# Patient Record
Sex: Female | Born: 1955
Health system: Southern US, Community
[De-identification: ages and names within clinical notes are randomized; demographics above are authoritative.]

## PROBLEM LIST (undated history)

## (undated) DIAGNOSIS — M1990 Unspecified osteoarthritis, unspecified site: Secondary | ICD-10-CM

## (undated) DIAGNOSIS — D649 Anemia, unspecified: Secondary | ICD-10-CM

## (undated) DIAGNOSIS — E0590 Thyrotoxicosis, unspecified without thyrotoxic crisis or storm: Secondary | ICD-10-CM

## (undated) DIAGNOSIS — E119 Type 2 diabetes mellitus without complications: Secondary | ICD-10-CM

## (undated) DIAGNOSIS — I10 Essential (primary) hypertension: Secondary | ICD-10-CM

## (undated) DIAGNOSIS — E7800 Pure hypercholesterolemia, unspecified: Secondary | ICD-10-CM

## (undated) DIAGNOSIS — K219 Gastro-esophageal reflux disease without esophagitis: Secondary | ICD-10-CM

## (undated) HISTORY — PX: COLONOSCOPY W/ POLYPECTOMY: SHX1380

## (undated) HISTORY — DX: Pure hypercholesterolemia, unspecified: E78.00

## (undated) HISTORY — PX: KNEE ARTHROSCOPY: SUR90

---

## 1998-01-14 ENCOUNTER — Other Ambulatory Visit: Admission: RE | Admit: 1998-01-14 | Discharge: 1998-01-14 | Payer: Self-pay | Admitting: *Deleted

## 1999-10-12 ENCOUNTER — Other Ambulatory Visit: Admission: RE | Admit: 1999-10-12 | Discharge: 1999-10-12 | Payer: Self-pay | Admitting: *Deleted

## 2000-12-15 ENCOUNTER — Other Ambulatory Visit: Admission: RE | Admit: 2000-12-15 | Discharge: 2000-12-15 | Payer: Self-pay | Admitting: Obstetrics & Gynecology

## 2000-12-15 ENCOUNTER — Encounter (INDEPENDENT_AMBULATORY_CARE_PROVIDER_SITE_OTHER): Payer: Self-pay

## 2001-04-07 ENCOUNTER — Other Ambulatory Visit: Admission: RE | Admit: 2001-04-07 | Discharge: 2001-04-07 | Payer: Self-pay | Admitting: Obstetrics & Gynecology

## 2001-04-07 ENCOUNTER — Encounter (INDEPENDENT_AMBULATORY_CARE_PROVIDER_SITE_OTHER): Payer: Self-pay

## 2003-12-16 ENCOUNTER — Inpatient Hospital Stay (HOSPITAL_COMMUNITY): Admission: EM | Admit: 2003-12-16 | Discharge: 2003-12-17 | Payer: Self-pay | Admitting: Emergency Medicine

## 2003-12-19 ENCOUNTER — Encounter: Admission: RE | Admit: 2003-12-19 | Discharge: 2003-12-19 | Payer: Self-pay | Admitting: Family Medicine

## 2010-02-10 ENCOUNTER — Encounter (HOSPITAL_COMMUNITY): Admission: RE | Admit: 2010-02-10 | Discharge: 2010-05-11 | Payer: Self-pay | Admitting: Internal Medicine

## 2010-02-25 ENCOUNTER — Encounter: Admission: RE | Admit: 2010-02-25 | Discharge: 2010-02-25 | Payer: Self-pay | Admitting: Internal Medicine

## 2010-04-07 ENCOUNTER — Encounter: Admission: RE | Admit: 2010-04-07 | Discharge: 2010-04-07 | Payer: Self-pay | Admitting: Endocrinology

## 2010-04-07 ENCOUNTER — Other Ambulatory Visit: Admission: RE | Admit: 2010-04-07 | Discharge: 2010-04-07 | Payer: Self-pay | Admitting: Diagnostic Radiology

## 2010-05-13 ENCOUNTER — Ambulatory Visit (HOSPITAL_COMMUNITY): Admission: RE | Admit: 2010-05-13 | Discharge: 2010-05-13 | Payer: Self-pay | Admitting: Endocrinology

## 2010-09-25 ENCOUNTER — Other Ambulatory Visit
Admission: RE | Admit: 2010-09-25 | Discharge: 2010-09-25 | Payer: Self-pay | Source: Home / Self Care | Admitting: Registered Nurse

## 2010-11-15 ENCOUNTER — Encounter: Payer: Self-pay | Admitting: Endocrinology

## 2011-01-09 LAB — HCG, SERUM, QUALITATIVE: Preg, Serum: NEGATIVE

## 2011-03-12 NOTE — Discharge Summary (Signed)
NAMEMarland House  AUBERY, DATE                        ACCOUNT NO.:  [id number]   MEDICAL RECORD NO.:  [id number]                   PATIENT TYPE:  INP   LOCATION:  5703                                 FACILITY:  [facility]   PHYSICIAN:  Emmit Alexanders, M.D.                      DATE OF BIRTH:  07/30/1956   DATE OF ADMISSION:  12/16/2003  DATE OF DISCHARGE:  12/17/2003                                 DISCHARGE SUMMARY   DISCHARGE DIAGNOSES:  1. Acute bronchitis.  2. Bronchospasm.  3. Hypertension.  4. Obesity.   CONSULTATIONS:  None.   PROCEDURE:  None.   RADIOGRAPHIC DATA:  Chest x-ray that was performed at [facility]  seemed to be consistent with acute bronchitis, questionable  new infiltrate  in the right lower lobe compared to December 13, 2003.   LABORATORY DATA:  Basic metabolic panel at discharge showed a sodium 138,  potassium 3.3, chloride 103, CO2 31, glucose 167, BUN 6, creatinine 0.7,  calcium 8.5.  CBC on admission showed a white blood cell count of 9,  hemoglobin 12.5, hematocrit 38.2, MCV 80.7, RDW 14.5, platelet count 325  with a normal differential.  Cardiac enzymes x2 were within normal limits at  the point of care in the emergency room.   EKG showed a normal sinus rhythm with a rate of 89 without ST changes.   HOSPITAL COURSE:  PROBLEM #1 -  ACUTE BRONCHITIS WITH BRONCHOSPASM:  This is  a 55 year old African American female patient who was seen at [facility] on [address]  She was initially seen last Friday with complaints of  chest pressure, headache and sore throat.  She was started on a Z pack.  She  was asked to follow up next week if she was not feeling better.  She  returned on Monday with worsening of her symptoms.  She was found to have  desaturations to  85% on room air.  She was therefore, sent to the hospital  to be admitted.  While in the hospital, she was treated with albuterol  nebulizers with improvement of her symptoms.  She was found to  be in  bronchospasm with wheezing on examination.  She was maintained on oxygen to  keep her saturations greater than 90%.  During her short stay, her  saturations improved to being weaned off oxygen with saturation of greater  than 98% on room air at rest.  After nebulizer treatment and walking the  halls, her saturations dropped to 90% but the patient was comfortable.  She  will be sent home on an albuterol MDI to take two puffs every two hours as  needed with a spacer.  She was treated with Tussionex and Guaifenesin here  in the hospital and will be discharged on the same.  She has follow-up  scheduled with Dr. Nicholos Johns on Tuesday, December 24, 2003.  Spirometer  may  be indicated in the future in this patient without a history of asthma or  smoking.   PROBLEM #2 -  HYPERTENSION:  The [facility] was [facility] to have an  elevated blood pressure up to 195/101.  This could be an acute stress  response.  During her hospitalization, she maintained a blood pressure in  between 124 and 168/72 to 83.  This was not treated during her acute  illness.  This will need to be followed as an outpatient.   DISCHARGE MEDICATIONS:  1. Albuterol MDI with spacer two puffs every two hours as needed for     shortness of breath or wheezing.  2. Guaifenesin 100 mg/5 mL two teaspoons p.o. every four hours as needed for     cough.  3. Tussionex one teaspoon every 12 hours as needed for cough.   PAIN MANAGEMENT:  Tylenol as needed.   ACTIVITY:  No restrictions.   DIET:  No restrictions.   WOUND CARE:  Not applicable.   SPECIAL INSTRUCTIONS:  She needs to follow up with her primary care  physician to discuss spirometry and hypertension treatment.   FOLLOW UP:  The patient has an appointment with Dr. Nicholos Johns on [address] on Tuesday, December 24, 2003, at 9:50 a.m.  His phone number  is [phone].                                                Emmit Alexanders, M.D.    DV/MEDQ  D:   12/17/2003  T:  12/18/2003  Job:  [id number]   cc:   Georgianne Fick, M.D.  [address]  [place]  [place] [address]  Fax: [phone]   [facility]  [address]

## 2011-03-12 NOTE — H&P (Signed)
NAME:  Sarah House, Sarah House                        ACCOUNT NO.:  [id number]   MEDICAL RECORD NO.:  [id number]                   PATIENT TYPE:  INP   LOCATION:  5703                                 FACILITY:  [facility]   PHYSICIAN:  Nani Gasser, M.D.            DATE OF BIRTH:  11-02-1955   DATE OF ADMISSION:  12/16/2003  DATE OF DISCHARGE:  12/17/2003                                HISTORY & PHYSICAL   CHIEF COMPLAINT:  Hypoxemia.   HISTORY OF PRESENT ILLNESS:  The patient come in about four days ago. She  started to notice a sore throat and some chest tightness in her chest, which  started the following day. She also complains of a dry cough during this  time period and started to have fever and some nausea with diarrhea. The  patient states she went to [facility] and had a chest x-ray done  and was treated for a bronchitis with Azithromycin and Tussionex. At that  particular visit her O2 was 93%. Over the weekend, the patient continued to  feel poorly and have fevers and chills and started wheezing and noticed an  increased feeling of fatigue with exertion. The patient says she slept a lot  over the weekend. She returned to urgent medical care today, however, repeat  chest x-ray showed questionable new infiltrate. Her O2 saturation at that  time was 85-95% on room air and she had a temperature of 101.2. She was  given two albuterol nebs today and did get some relief of her wheezing and  chest tightness. Her O2 saturation came up to 94-95% after the two nebs. The  patient also has been using over-the-counter NyQuil in addition to her  Azithromycin and Tussionex. The patient's peak flows after nebulization or  180/200.   PAST MEDICAL HISTORY:  Hypertension having been recorded at her two visits  to urgent medical, but denies any previous history of hypertension.   REVIEW OF SYSTEMS:  Positive for fevers. She has lost seven pounds over the  past four days. HEENT:  Please see  above. No nasal congestion. She does wear  glasses and no vision changes. PULMONARY:  Shortness of breath with exertion  this weekend. CARDIAC:  No palpitations and some chest tightness, see above.  NEUROLOGIC:  No dizziness, no syncope, no numbness, or tingling. HEME:  Not  symptomatic. ENDOCRINE:  No hormonal changes and she is still menstruating.   PAST SURGICAL HISTORY:  No prior surgeries.   MEDICATIONS:  1. Caltrate-D daily.  2. B-6 daily.   ALLERGIES:  No known drug allergies.   SOCIAL HISTORY:  Lives with husband. She has one living son. She is a  Nurse, children's. She denies any tobacco, alcohol, or  drug use.   FAMILY HISTORY:  Mom died at age 73 from diabetes, hypertension, heart  disease, and renal disease. Dad died in his 10s from lymphoma.   PHYSICAL EXAMINATION:  VITAL SIGNS: Temperature 99.2, heart rate 94,  respirations 15, blood pressure 172/90, and she is 96% on 4 L nasal cannula.  GENERAL:  In no acute distress. Pleasant. Appears comfortable, obese.  HEENT:  Head is atraumatic, normocephalic. Extraocular movements intact.  Pupils equal round and reactive. Sclerae are white. Conjunctivae normal.  Oropharynx is clear.  PULMONARY:  She does have expiratory wheezes bilaterally and diffusely. No  crackles. The patient is giving good effort.  NECK:  No lymphadenopathy. No thyromegaly. Trachea is midline.  CARDIAC:  Regular rate and rhythm with no murmurs. Carotid pulses 2+  bilaterally. Dorsalis pedal pulses 2+ bilaterally. Carotid 2+ bilaterally,  no carotid bruit.  EXTREMITIES:  No lower extremity edema.  ABDOMEN:  Obese, nontender, nondistended with normal bowel sounds. Soft. No  organomegaly.  MUSCULOSKELETAL:  Grossly intact.   LABORATORY DATA:  From February 18 she had a white count of 9.8, hemoglobin  12.1, with negative cardiac enzymes. On February 21 she had a white count of  9 with a hemoglobin of 12.5 and a normal BMET with a  creatinine of 0.8. She  had cardiac markers in the emergency department x2, which were normal. EKG  on February 18 showed a rate of 89 with normal sinus rhythm and no ST-T wave  changes. Chest x-ray from February 21 showed evidence of bronchitis.  Impression was new infiltrate in the right lower lobe compared to a previous  chest x-ray on February 18.   ASSESSMENT:  A 55 year old with hypoxemia.   PLAN:  1. Hypoxemia probably secondary to acute bronchitis with new infiltrate.     Still febrile on Azithromycin. Suspect viral cause. Antibiotics for now     and monitor overnight. We will wean O2 as tolerated.  2. Hypertension.  The patient reports systolic blood pressure was normally     in the 130s. Suspect maybe that her current hypertension may be due to     acute illness and her medicines. We will follow. If blood pressure     remains elevated we will consider starting on an antihypertensive or at     her follow-up appointment with her PCP.  3. Chest tightness. Most likely secondary to bronchitis. The patient is low     risk for MI. We will repeat an EKG and one more set of enzymes.                                                Nani Gasser, M.D.    CM/MEDQ  D:  12/21/2003  T:  12/21/2003  Job:  [id number]

## 2012-02-23 IMAGING — US US BIOPSY
1 series · 13 of 14 positions shown · non-contrast
Comparison: none

CLINICAL HISTORY: 54-year-old with bilateral thyroid nodules.

[Series 1: us biopsy · 0.08mm/px · 14 acquisitions, 13 frames shown]
[im 1/14]
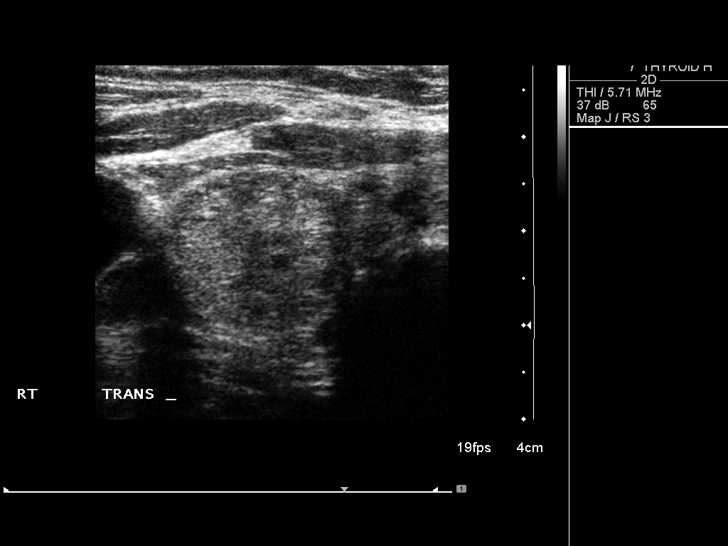
[im 2/14]
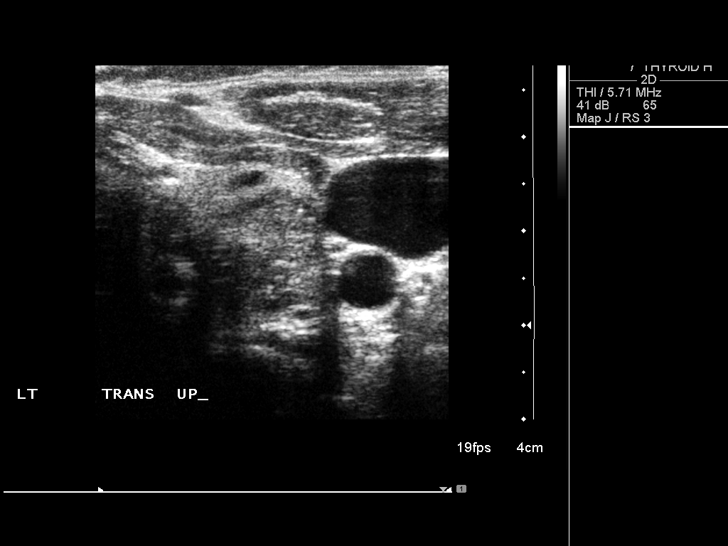
[im 3/14]
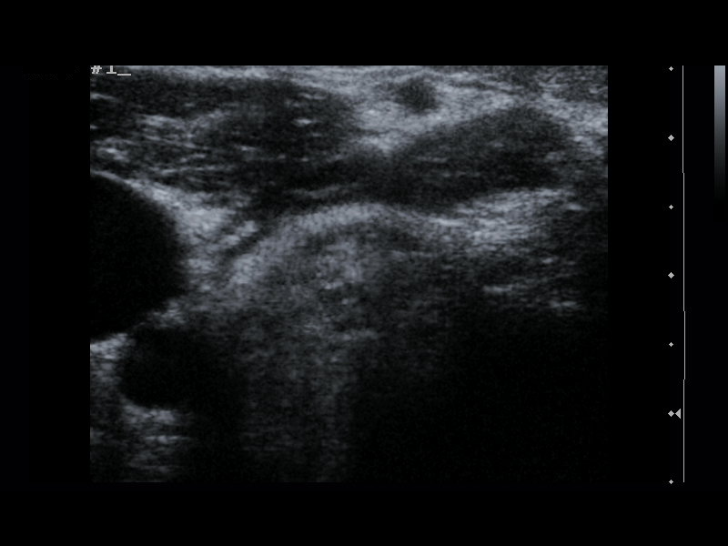
[im 4/14]
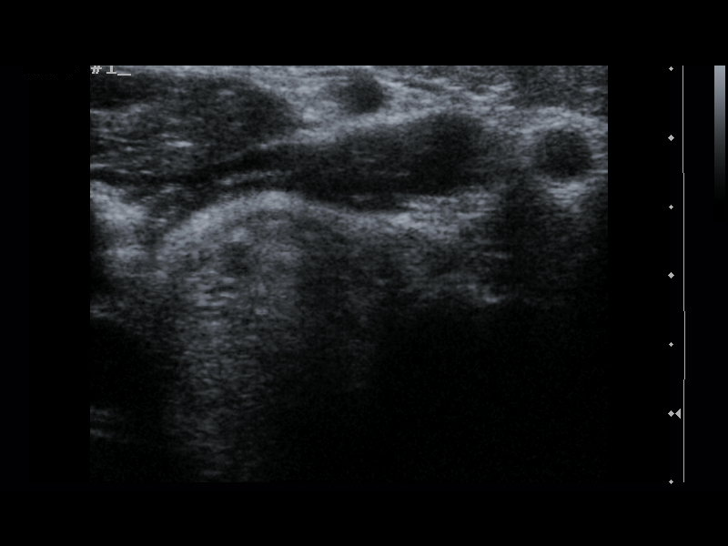
[im 5/14]
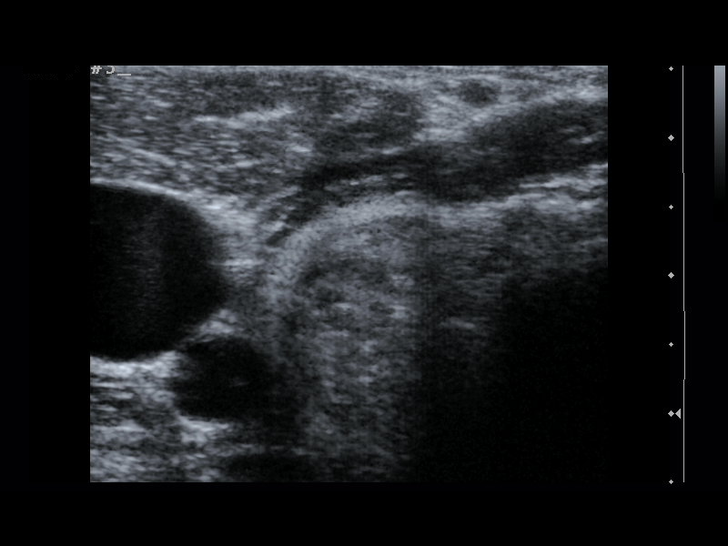
[im 6/14]
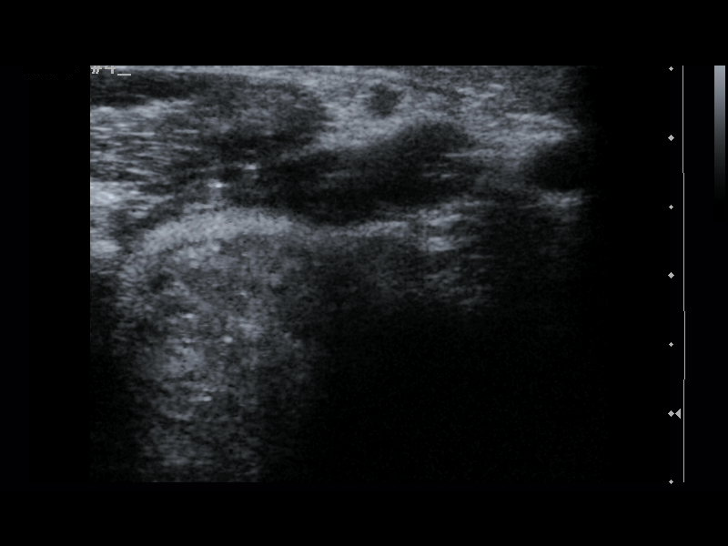
[im 8/14]
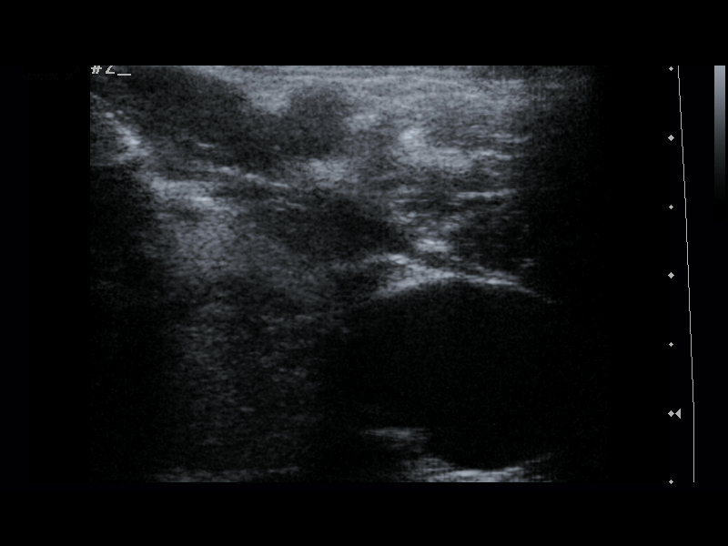
[im 9/14]
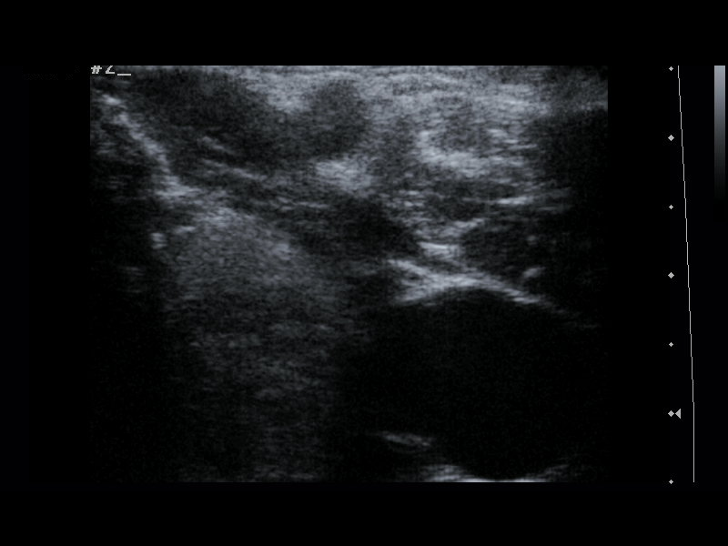
[im 10/14]
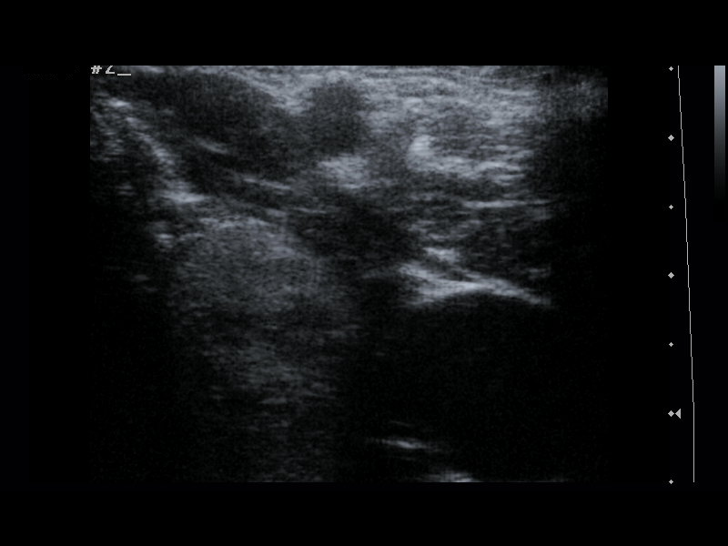
[im 11/14]
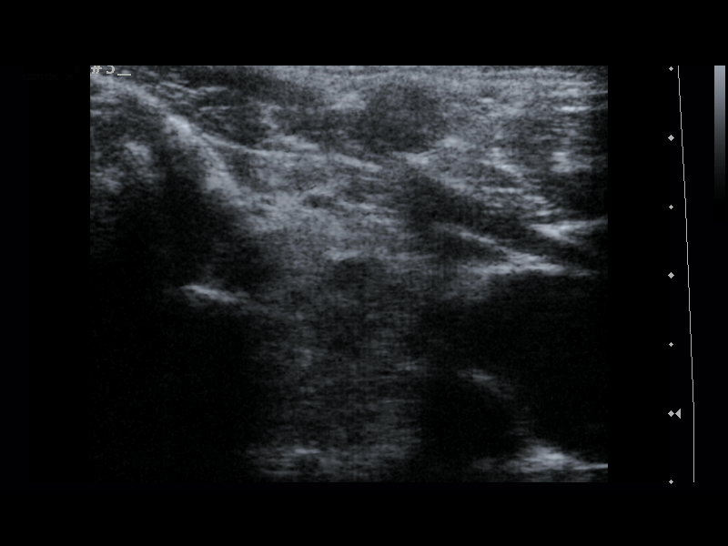
[im 12/14]
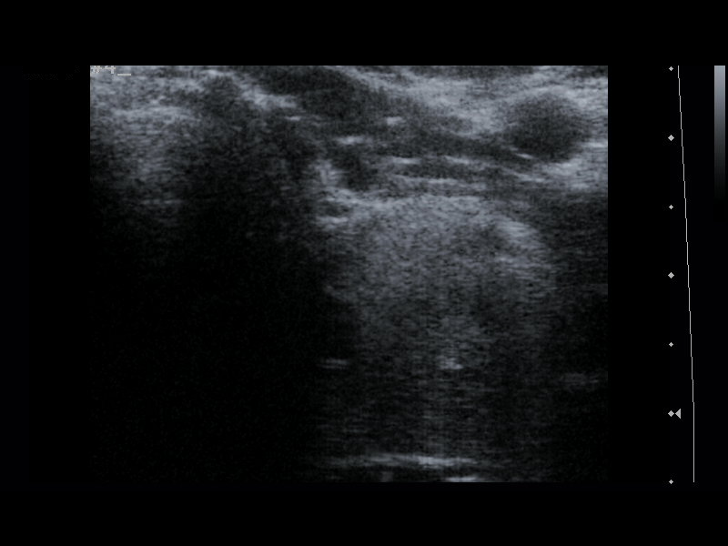
[im 13/14]
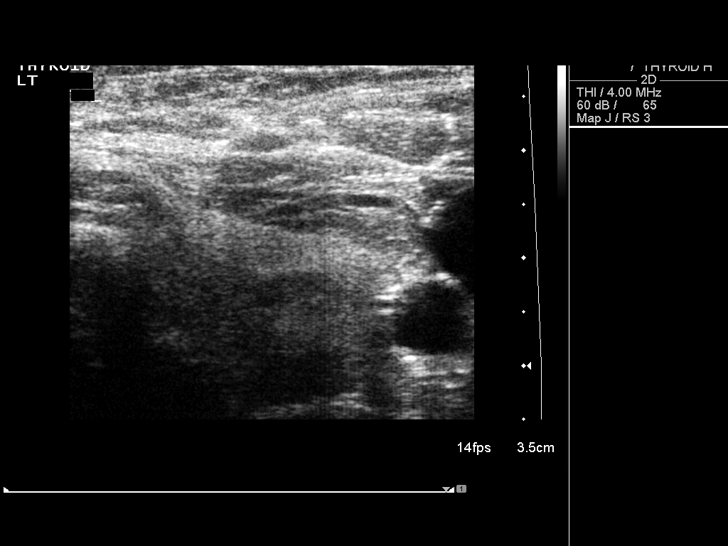
[im 14/14]
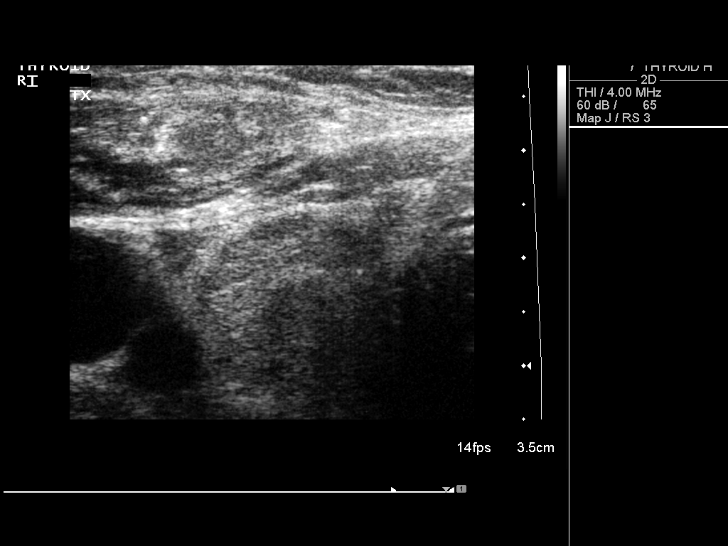

[13 of 14 positions shown; findings below may reference images not displayed]

PROCEDURE(S): ULTRASOUND GUIDED FINE NEEDLE ASPIRATION OF BILATERAL
THYROID NODULES

Medications:None

Moderate sedation time:None

Fluoroscopy time: None

Procedure:The procedure was explained to the patient.  The risks
and benefits of the procedure were discussed and the patient's
questions were addressed.  Informed consent was obtained from the
patient.  The neck was prepped and draped in a sterile fashion.
The large nodule in the right upper lobe was targeted.  The skin
was anesthetized with 1% lidocaine.  Four fine needle aspirations
were obtained with ultrasound guidance using 25 gauge needles.  No
immediate complication.

The dominant nodule in the upper portion of the left thyroid lobe
was targeted. The left neck was anesthetized with 1% lidocaine.
Four ultrasound guided fine needle aspirations were obtained with
25 gauge needles.  No immediate complication.
FINDINGS: Dominant heterogeneous nodule in the superior aspect of
the right thyroid lobe.  Dominant heterogeneous nodule in the upper
portion of the left thyroid lobe.
IMPRESSION: Ultrasound guided fine needle aspirations of bilateral
thyroid nodules.

## 2014-09-02 ENCOUNTER — Ambulatory Visit (INDEPENDENT_AMBULATORY_CARE_PROVIDER_SITE_OTHER): Payer: Managed Care, Other (non HMO) | Admitting: Podiatry

## 2014-09-02 DIAGNOSIS — B351 Tinea unguium: Secondary | ICD-10-CM

## 2014-09-02 DIAGNOSIS — M79673 Pain in unspecified foot: Secondary | ICD-10-CM

## 2014-09-04 NOTE — Progress Notes (Signed)
Subjective:     Patient ID: Sarah House, female   DOB: 07/25/1956, 58 y.o.   MRN: [id number]  HPIpatient presents with elongated nailbeds 1-5 both feet stating that they hurt her and makes shoe gear difficult   Review of Systems     Objective:   Physical Exam Neurovascular status intact with thick yellow brittle nailbeds 1-5 of both feet    Assessment:     Mycotic nail infection with pain 1-5 both feet    Plan:     Debride painful nailbeds 1-5 both feet with no iatrogenic bleeding noted      

## 2014-12-09 ENCOUNTER — Other Ambulatory Visit: Payer: Managed Care, Other (non HMO)

## 2015-01-06 ENCOUNTER — Ambulatory Visit (INDEPENDENT_AMBULATORY_CARE_PROVIDER_SITE_OTHER): Payer: Managed Care, Other (non HMO) | Admitting: Podiatry

## 2015-01-06 ENCOUNTER — Encounter: Payer: Self-pay | Admitting: Podiatry

## 2015-01-06 VITALS — BP 139/69 | HR 79 | Resp 20

## 2015-01-06 DIAGNOSIS — M79673 Pain in unspecified foot: Secondary | ICD-10-CM | POA: Diagnosis not present

## 2015-01-06 DIAGNOSIS — B351 Tinea unguium: Secondary | ICD-10-CM

## 2015-01-06 NOTE — Progress Notes (Signed)
Subjective:     Patient ID: Sarah House, female   DOB: 12-01-1955, 59 y.o.   MRN: [id number]  HPIpatient presents with elongated nailbeds 1-5 both feet stating that they hurt her and makes shoe gear difficult   Review of Systems     Objective:   Physical Exam Neurovascular status intact with thick yellow brittle nailbeds 1-5 of both feet    Assessment:     Mycotic nail infection with pain 1-5 both feet    Plan:     Debride painful nailbeds 1-5 both feet with no iatrogenic bleeding noted

## 2015-01-07 NOTE — Progress Notes (Signed)
Subjective:     Patient ID: Sarah House, female   DOB: [birth year], 59 y.o.   MRN: [id number]  HPI patient presents stating I have nails that are bothering me 1-5 both feet that are painful   Review of Systems     Objective:   Physical Exam Neurovascular status intact with thick yellow incurvated nailbeds 1-5 both feet that are painful and impossible for her to cut with at risk medical condition    Assessment:     Mycotic nail infection with at risk patient with pain    Plan:     Debride painful nailbeds 1-5 both feet with no iatrogenic bleeding noted

## 2015-04-07 ENCOUNTER — Ambulatory Visit: Payer: Managed Care, Other (non HMO)

## 2015-04-10 ENCOUNTER — Ambulatory Visit: Payer: Managed Care, Other (non HMO)

## 2015-05-19 ENCOUNTER — Other Ambulatory Visit: Payer: Self-pay | Admitting: Registered Nurse

## 2015-05-19 ENCOUNTER — Other Ambulatory Visit (HOSPITAL_COMMUNITY)
Admission: RE | Admit: 2015-05-19 | Discharge: 2015-05-19 | Disposition: A | Discharge status: Home / Self Care | Payer: Managed Care, Other (non HMO) | Source: Ambulatory Visit | Attending: Internal Medicine | Admitting: Internal Medicine

## 2015-05-19 DIAGNOSIS — Z01419 Encounter for gynecological examination (general) (routine) without abnormal findings: Secondary | ICD-10-CM | POA: Insufficient documentation

## 2015-05-21 LAB — CYTOLOGY - PAP

## 2015-11-17 ENCOUNTER — Encounter: Payer: Self-pay | Admitting: Podiatry

## 2015-11-17 ENCOUNTER — Ambulatory Visit (INDEPENDENT_AMBULATORY_CARE_PROVIDER_SITE_OTHER): Payer: Managed Care, Other (non HMO) | Admitting: Podiatry

## 2015-11-17 DIAGNOSIS — B351 Tinea unguium: Secondary | ICD-10-CM | POA: Diagnosis not present

## 2015-11-17 DIAGNOSIS — M79673 Pain in unspecified foot: Secondary | ICD-10-CM

## 2015-11-17 NOTE — Progress Notes (Signed)
Patient ID: Sarah House, female   DOB: September 02, 1956, 60 y.o.   MRN: [id number]  Subjective: 60 y.o. returns the office today for painful, elongated, thickened toenails which she cannot trim herself. Denies any redness or drainage around the nails. Denies any acute changes since last appointment and no new complaints today. Denies any systemic complaints such as fevers, chills, nausea, vomiting.   Objective: AAO 3, NAD DP/PT pulses palpable, CRT less than 3 seconds Nails hypertrophic, dystrophic, elongated, brittle, discolored 10. There is tenderness overlying the nails 1-5 bilaterally. There is no surrounding erythema or drainage along the nail sites. No open lesions or pre-ulcerative lesions are identified. No other areas of tenderness bilateral lower extremities. No overlying edema, erythema, increased warmth. No pain with calf compression, swelling, warmth, erythema.  Assessment: Patient presents with symptomatic onychomycosis  Plan: -Treatment options including alternatives, risks, complications were discussed -Nails sharply debrided 10 without complication/bleeding. -Discussed daily foot inspection. If there are any changes, to call the office immediately.  -Follow-up in 3 months or sooner if any problems are to arise. In the meantime, encouraged to call the office with any questions, concerns, changes symptoms.  Celesta Gentile, DPM

## 2015-12-12 ENCOUNTER — Other Ambulatory Visit: Payer: Self-pay | Admitting: Surgery

## 2015-12-12 DIAGNOSIS — N6310 Unspecified lump in the right breast, unspecified quadrant: Secondary | ICD-10-CM

## 2015-12-16 ENCOUNTER — Encounter (HOSPITAL_BASED_OUTPATIENT_CLINIC_OR_DEPARTMENT_OTHER): Payer: Self-pay | Admitting: *Deleted

## 2015-12-16 NOTE — Progress Notes (Signed)
Patient's stated weight is 295, projected BMI is 46.1. She will come in for labs, EKG and anesthesia consult. No hx OSA.

## 2015-12-19 ENCOUNTER — Encounter (HOSPITAL_BASED_OUTPATIENT_CLINIC_OR_DEPARTMENT_OTHER)
Admission: RE | Admit: 2015-12-19 | Discharge: 2015-12-19 | Disposition: A | Discharge status: Home / Self Care | Payer: Managed Care, Other (non HMO) | Source: Ambulatory Visit | Attending: Surgery | Admitting: Surgery

## 2015-12-19 ENCOUNTER — Other Ambulatory Visit: Payer: Self-pay

## 2015-12-19 DIAGNOSIS — Z6841 Body Mass Index (BMI) 40.0 and over, adult: Secondary | ICD-10-CM | POA: Diagnosis not present

## 2015-12-19 DIAGNOSIS — Z79899 Other long term (current) drug therapy: Secondary | ICD-10-CM | POA: Diagnosis not present

## 2015-12-19 DIAGNOSIS — D241 Benign neoplasm of right breast: Secondary | ICD-10-CM | POA: Diagnosis not present

## 2015-12-19 DIAGNOSIS — Z794 Long term (current) use of insulin: Secondary | ICD-10-CM | POA: Diagnosis not present

## 2015-12-19 DIAGNOSIS — N63 Unspecified lump in breast: Secondary | ICD-10-CM | POA: Diagnosis present

## 2015-12-19 DIAGNOSIS — E119 Type 2 diabetes mellitus without complications: Secondary | ICD-10-CM | POA: Diagnosis not present

## 2015-12-19 DIAGNOSIS — I10 Essential (primary) hypertension: Secondary | ICD-10-CM | POA: Diagnosis not present

## 2015-12-19 LAB — BASIC METABOLIC PANEL
ANION GAP: 11 (ref 5–15)
BUN: 10 mg/dL (ref 6–20)
CO2: 27 mmol/L (ref 22–32)
Calcium: 9.4 mg/dL (ref 8.9–10.3)
Chloride: 105 mmol/L (ref 101–111)
Creatinine, Ser: 0.6 mg/dL (ref 0.44–1.00)
GFR calc Af Amer: >60 mL/min (ref >60)
GFR calc non Af Amer: >60 mL/min (ref >60)
GLUCOSE: 123 mg/dL — AB (ref 65–99)
POTASSIUM: 4.4 mmol/L (ref 3.5–5.1)
Sodium: 143 mmol/L (ref 135–145)

## 2015-12-19 NOTE — Progress Notes (Signed)
Anesthesia consult by Dr Fredirick Maudlin, clear for surgery at surgery center

## 2015-12-22 NOTE — H&P (Signed)
Sarah House 12/12/2015 11:14 AM Location: [facility] Patient #: [id number] DOB: 1956/07/16 Married / Language: English / Race: Black or African American Female   History of Present Illness (Amariyah Bazar A. Ninfa Linden MD; 12/12/2015 11:36 AM) The patient is a 60 year old female who presents with a breast mass. This is a pleasant patient referred by Dr. Christene Slates for the evaluation of a right breast mass. There was a suspicious mass found on her most recent mammogram. She ended up having a stereotactic biopsy of the mass as well as an enlarged lymph node. The lymph node was benign. The mass was consistent with either a phyllodes tumor or fibroadenoma. Surgical removal has been recommended. She has had no problems regarding her breasts other than benign biopsies in the past. She is otherwise without complaint is doing well. There is no family history of breast cancer   Other Problems Elbert Ewings, CMA; 12/12/2015 11:14 AM) Arthritis Diabetes Mellitus Gastroesophageal Reflux Disease Hypercholesterolemia Lump In Breast  Past Surgical History Elbert Ewings, CMA; 12/12/2015 11:14 AM) Breast Biopsy Bilateral. Colon Polyp Removal - Colonoscopy Oral Surgery Sentinel Lymph Node Biopsy  Diagnostic Studies History Elbert Ewings, CMA; 12/12/2015 11:14 AM) Colonoscopy 1-5 years ago Mammogram within last year Pap Smear 1-5 years ago  Allergies Elbert Ewings, CMA; 12/12/2015 11:15 AM) No Known Drug Allergies02/17/2017  Medication History Elbert Ewings, CMA; 12/12/2015 11:16 AM) Losartan Potassium-HCTZ (100-25MG  Tablet, Oral) Active. Lantus SoloStar (100UNIT/ML Soln Pen-inj, Subcutaneous) Active. Amaryl (4MG  Tablet, Oral) Active. MetFORMIN HCl (500MG  Tablet, Oral) Active. PriLOSEC (10MG  Capsule DR, Oral) Active. Calcium (500MG  Tablet, Oral) Active. Medications Reconciled  Social History Elbert Ewings, [place]; 12/12/2015 11:14 AM) Alcohol use Remotely quit alcohol  use. Caffeine use Carbonated beverages, Coffee, Tea. Illicit drug use Remotely quit drug use. Tobacco use Never smoker.  Family History Elbert Ewings, [place]; 12/12/2015 11:14 AM) Cancer Father. Diabetes Mellitus Mother. Heart Disease Sister. Heart disease in female family member before age 59 Kidney Disease Mother. Respiratory Condition Mother.  Pregnancy / Birth History Elbert Ewings, CMA; 12/12/2015 11:14 AM) Age at menarche 64 years. Contraceptive History Oral contraceptives. Gravida 1 Irregular periods Maternal age 79-25 Para 1    Review of Systems Elbert Ewings CMA; 12/12/2015 11:14 AM) General Not Present- Appetite Loss, Chills, Fatigue, Fever, Night Sweats, Weight Gain and Weight Loss. Skin Not Present- Change in Wart/Mole, Dryness, Hives, Jaundice, New Lesions, Non-Healing Wounds, Rash and Ulcer. HEENT Present- Seasonal Allergies and Wears glasses/contact lenses. Not Present- Earache, Hearing Loss, Hoarseness, Nose Bleed, Oral Ulcers, Ringing in the Ears, Sinus Pain, Sore Throat, Visual Disturbances and Yellow Eyes. Respiratory Not Present- Bloody sputum, Chronic Cough, Difficulty Breathing, Snoring and Wheezing. Breast Present- Breast Mass. Not Present- Breast Pain, Nipple Discharge and Skin Changes. Cardiovascular Not Present- Chest Pain, Difficulty Breathing Lying Down, Leg Cramps, Palpitations, Rapid Heart Rate, Shortness of Breath and Swelling of Extremities. Gastrointestinal Present- Indigestion. Not Present- Abdominal Pain, Bloating, Bloody Stool, Change in Bowel Habits, Chronic diarrhea, Constipation, Difficulty Swallowing, Excessive gas, Gets full quickly at meals, Hemorrhoids, Nausea, Rectal Pain and Vomiting. Female Genitourinary Present- Frequency. Not Present- Nocturia, Painful Urination, Pelvic Pain and Urgency. Musculoskeletal Present- Joint Stiffness. Not Present- Back Pain, Joint Pain, Muscle Pain, Muscle Weakness and Swelling of  Extremities. Neurological Present- Trouble walking. Not Present- Decreased Memory, Fainting, Headaches, Numbness, Seizures, Tingling, Tremor and Weakness. Psychiatric Not Present- Anxiety, Bipolar, Change in Sleep Pattern, Depression, Fearful and Frequent crying. Endocrine Present- Hot flashes. Not Present- Cold Intolerance, Excessive Hunger, Hair Changes, Heat Intolerance and New  Diabetes. Hematology Not Present- Easy Bruising, Excessive bleeding, Gland problems, HIV and Persistent Infections.  Vitals Elbert Ewings CMA; 12/12/2015 11:16 AM) 12/12/2015 11:16 AM Weight: 304.6 lb Temp.: 97.34F(Temporal)  Pulse: 109 (Regular)  BP: 140/82 (Sitting, Left Arm, Standard)       Physical Exam (Kenniya Westrich A. Ninfa Linden MD; 12/12/2015 11:38 AM) General Mental Status-Alert. General Appearance-Consistent with stated age. Hydration-Well hydrated. Voice-Normal.  Head and Neck Head-normocephalic, atraumatic with no lesions or palpable masses. Trachea-midline. Thyroid Gland Characteristics - normal size and consistency.  Eye Eyeball - Bilateral-Extraocular movements intact. Sclera/Conjunctiva - Bilateral-No scleral icterus.  Chest and Lung Exam Chest and lung exam reveals -quiet, even and easy respiratory effort with no use of accessory muscles and on auscultation, normal breath sounds, no adventitious sounds and normal vocal resonance. Inspection Chest Wall - Normal. Back - normal.  Breast Breast - Left-Symmetric, Non Tender, No Biopsy scars, no Dimpling, No Inflammation, No Lumpectomy scars, No Mastectomy scars, No Peau d' Orange. Breast - Right-Symmetric, Non Tender, No Biopsy scars, no Dimpling, No Inflammation, No Lumpectomy scars, No Mastectomy scars, No Peau d' Orange. Note: On exam, there is a questionable fullness at the 5 o'clock position of the right breast but I am uncertain whether this a small hematoma actua or the mass. There is no axillary  lymphadenopathy Breast Lump-No Palpable Breast Mass.  Cardiovascular Cardiovascular examination reveals -normal heart sounds, regular rate and rhythm with no murmurs and normal pedal pulses bilaterally.  Abdomen Inspection Inspection of the abdomen reveals - No Hernias. Skin - Scar - no surgical scars. Palpation/Percussion Palpation and Percussion of the abdomen reveal - Soft, Non Tender, No Rebound tenderness, No Rigidity (guarding) and No hepatosplenomegaly. Auscultation Auscultation of the abdomen reveals - Bowel sounds normal.  Neurologic Neurologic evaluation reveals -alert and oriented x 3 with no impairment of recent or remote memory. Mental Status-Normal.  Musculoskeletal Normal Exam - Left-Upper Extremity Strength Normal and Lower Extremity Strength Normal. Normal Exam - Right-Upper Extremity Strength Normal and Lower Extremity Strength Normal.  Lymphatic Head & Neck  General Head & Neck Lymphatics: Bilateral - Description - Normal. Axillary  General Axillary Region: Bilateral - Description - Normal. Tenderness - Non Tender. Femoral & Inguinal - Did not examine.    Assessment & Plan (Leeya Rusconi A. Ninfa Linden MD; 12/12/2015 11:38 AM) BREAST MASS, RIGHT (N63) Impression: I discussed this with the patient and her husband. Radioactive seed localized right breast lumpectomy is recommended. I discussed the procedure with him in detail. I discussed the risks which include but is not limited to bleeding, infection, need for further surgery, etc. They understand and wish to proceed with surgery

## 2015-12-23 ENCOUNTER — Ambulatory Visit (HOSPITAL_BASED_OUTPATIENT_CLINIC_OR_DEPARTMENT_OTHER): Payer: Managed Care, Other (non HMO) | Admitting: Certified Registered"

## 2015-12-23 ENCOUNTER — Encounter (HOSPITAL_BASED_OUTPATIENT_CLINIC_OR_DEPARTMENT_OTHER)
Admission: RE | Disposition: A | Discharge status: Home / Self Care | Payer: Self-pay | Source: Ambulatory Visit | Attending: Surgery

## 2015-12-23 ENCOUNTER — Encounter (HOSPITAL_BASED_OUTPATIENT_CLINIC_OR_DEPARTMENT_OTHER): Payer: Self-pay | Admitting: Certified Registered"

## 2015-12-23 ENCOUNTER — Ambulatory Visit (HOSPITAL_BASED_OUTPATIENT_CLINIC_OR_DEPARTMENT_OTHER)
Admission: RE | Admit: 2015-12-23 | Discharge: 2015-12-23 | Disposition: A | Discharge status: Home / Self Care | Payer: Managed Care, Other (non HMO) | Source: Ambulatory Visit | Attending: Surgery | Admitting: Surgery

## 2015-12-23 DIAGNOSIS — D241 Benign neoplasm of right breast: Secondary | ICD-10-CM | POA: Insufficient documentation

## 2015-12-23 DIAGNOSIS — Z6841 Body Mass Index (BMI) 40.0 and over, adult: Secondary | ICD-10-CM | POA: Insufficient documentation

## 2015-12-23 DIAGNOSIS — Z79899 Other long term (current) drug therapy: Secondary | ICD-10-CM | POA: Insufficient documentation

## 2015-12-23 DIAGNOSIS — Z794 Long term (current) use of insulin: Secondary | ICD-10-CM | POA: Insufficient documentation

## 2015-12-23 DIAGNOSIS — E119 Type 2 diabetes mellitus without complications: Secondary | ICD-10-CM | POA: Insufficient documentation

## 2015-12-23 DIAGNOSIS — I10 Essential (primary) hypertension: Secondary | ICD-10-CM | POA: Insufficient documentation

## 2015-12-23 HISTORY — DX: Type 2 diabetes mellitus without complications: E11.9

## 2015-12-23 HISTORY — DX: Essential (primary) hypertension: I10

## 2015-12-23 HISTORY — PX: BREAST LUMPECTOMY WITH RADIOACTIVE SEED LOCALIZATION: SHX6424

## 2015-12-23 HISTORY — DX: Thyrotoxicosis, unspecified without thyrotoxic crisis or storm: E05.90

## 2015-12-23 HISTORY — DX: Gastro-esophageal reflux disease without esophagitis: K21.9

## 2015-12-23 HISTORY — DX: Unspecified osteoarthritis, unspecified site: M19.90

## 2015-12-23 LAB — GLUCOSE, CAPILLARY
GLUCOSE-CAPILLARY: 132 mg/dL — AB (ref 65–99)
Glucose-Capillary: 118 mg/dL — ABNORMAL HIGH (ref 65–99)

## 2015-12-23 SURGERY — BREAST LUMPECTOMY WITH RADIOACTIVE SEED LOCALIZATION
Anesthesia: General | Site: Breast | Laterality: Right

## 2015-12-23 MED ORDER — HYDROCODONE-ACETAMINOPHEN 5-325 MG PO TABS: 1.0000 | ORAL_TABLET | ORAL | Status: DC | PRN

## 2015-12-23 MED ORDER — LIDOCAINE HCL (CARDIAC) 20 MG/ML IV SOLN
INTRAVENOUS | Status: DC | PRN
  Administered 2015-12-23: 80 mg via INTRAVENOUS

## 2015-12-23 MED ORDER — CEFAZOLIN SODIUM 1-5 GM-% IV SOLN
INTRAVENOUS | Status: AC
  Filled 2015-12-23: qty 50

## 2015-12-23 MED ORDER — MEPERIDINE HCL 25 MG/ML IJ SOLN: 6.2500 mg | INTRAMUSCULAR | Status: DC | PRN

## 2015-12-23 MED ORDER — OXYCODONE HCL 5 MG PO TABS
ORAL_TABLET | ORAL | Status: AC
  Filled 2015-12-23: qty 1

## 2015-12-23 MED ORDER — ONDANSETRON HCL 4 MG/2ML IJ SOLN
INTRAMUSCULAR | Status: AC
  Filled 2015-12-23: qty 2

## 2015-12-23 MED ORDER — OXYCODONE HCL 5 MG PO TABS
5.0000 mg | ORAL_TABLET | Freq: Once | ORAL | Status: AC | PRN
  Administered 2015-12-23: 5 mg via ORAL

## 2015-12-23 MED ORDER — HYDROMORPHONE HCL 1 MG/ML IJ SOLN: 0.2500 mg | INTRAMUSCULAR | Status: DC | PRN

## 2015-12-23 MED ORDER — MIDAZOLAM HCL 2 MG/2ML IJ SOLN
1.0000 mg | INTRAMUSCULAR | Status: DC | PRN
  Administered 2015-12-23: 2 mg via INTRAVENOUS

## 2015-12-23 MED ORDER — FENTANYL CITRATE (PF) 100 MCG/2ML IJ SOLN
50.0000 ug | INTRAMUSCULAR | Status: DC | PRN
  Administered 2015-12-23: 100 ug via INTRAVENOUS

## 2015-12-23 MED ORDER — MIDAZOLAM HCL 2 MG/2ML IJ SOLN
INTRAMUSCULAR | Status: AC
  Filled 2015-12-23: qty 2

## 2015-12-23 MED ORDER — PHENYLEPHRINE 40 MCG/ML (10ML) SYRINGE FOR IV PUSH (FOR BLOOD PRESSURE SUPPORT)
PREFILLED_SYRINGE | INTRAVENOUS | Status: AC
  Filled 2015-12-23: qty 10

## 2015-12-23 MED ORDER — GLYCOPYRROLATE 0.2 MG/ML IJ SOLN: 0.2000 mg | Freq: Once | INTRAMUSCULAR | Status: DC | PRN

## 2015-12-23 MED ORDER — LACTATED RINGERS IV SOLN
INTRAVENOUS | Status: DC
  Administered 2015-12-23 (×2): via INTRAVENOUS

## 2015-12-23 MED ORDER — BUPIVACAINE-EPINEPHRINE (PF) 0.5% -1:200000 IJ SOLN
INTRAMUSCULAR | Status: AC
  Filled 2015-12-23: qty 30

## 2015-12-23 MED ORDER — SCOPOLAMINE 1 MG/3DAYS TD PT72: 1.0000 | MEDICATED_PATCH | Freq: Once | TRANSDERMAL | Status: DC | PRN

## 2015-12-23 MED ORDER — SODIUM CHLORIDE 0.9 % IN NEBU
INHALATION_SOLUTION | RESPIRATORY_TRACT | Status: AC
  Filled 2015-12-23: qty 3

## 2015-12-23 MED ORDER — FENTANYL CITRATE (PF) 100 MCG/2ML IJ SOLN
INTRAMUSCULAR | Status: AC
  Filled 2015-12-23: qty 2

## 2015-12-23 MED ORDER — OXYCODONE HCL 5 MG/5ML PO SOLN: 5.0000 mg | Freq: Once | ORAL | Status: AC | PRN

## 2015-12-23 MED ORDER — ONDANSETRON HCL 4 MG/2ML IJ SOLN
INTRAMUSCULAR | Status: DC | PRN
  Administered 2015-12-23: 4 mg via INTRAVENOUS

## 2015-12-23 MED ORDER — BUPIVACAINE-EPINEPHRINE 0.5% -1:200000 IJ SOLN
INTRAMUSCULAR | Status: DC | PRN
  Administered 2015-12-23: 10 mL

## 2015-12-23 MED ORDER — CEFAZOLIN SODIUM-DEXTROSE 2-3 GM-% IV SOLR
2.0000 g | INTRAVENOUS | Status: AC
  Administered 2015-12-23: 3 g via INTRAVENOUS

## 2015-12-23 MED ORDER — PROPOFOL 10 MG/ML IV BOLUS
INTRAVENOUS | Status: DC | PRN
  Administered 2015-12-23: 200 mg via INTRAVENOUS

## 2015-12-23 MED ORDER — LIDOCAINE HCL (CARDIAC) 20 MG/ML IV SOLN
INTRAVENOUS | Status: AC
  Filled 2015-12-23: qty 5

## 2015-12-23 MED ORDER — CEFAZOLIN SODIUM-DEXTROSE 2-3 GM-% IV SOLR
INTRAVENOUS | Status: AC
  Filled 2015-12-23: qty 50

## 2015-12-23 MED ORDER — DEXAMETHASONE SODIUM PHOSPHATE 4 MG/ML IJ SOLN
INTRAMUSCULAR | Status: DC | PRN
  Administered 2015-12-23: 4 mg via INTRAVENOUS

## 2015-12-23 MED ORDER — DEXAMETHASONE SODIUM PHOSPHATE 10 MG/ML IJ SOLN
INTRAMUSCULAR | Status: AC
  Filled 2015-12-23: qty 1

## 2015-12-23 SURGICAL SUPPLY — 46 items
APPLIER CLIP 9.375 MED OPEN (MISCELLANEOUS)
BLADE HEX COATED 2.75 (ELECTRODE) ×3 IMPLANT
BLADE SURG 15 STRL LF DISP TIS (BLADE) ×1 IMPLANT
BLADE SURG 15 STRL SS (BLADE) ×2
CANISTER SUCT 1200ML W/VALVE (MISCELLANEOUS) IMPLANT
CHLORAPREP W/TINT 26ML (MISCELLANEOUS) ×3 IMPLANT
CLIP APPLIE 9.375 MED OPEN (MISCELLANEOUS) IMPLANT
CLIP TI WIDE RED SMALL 6 (CLIP) IMPLANT
COVER BACK TABLE 60X90IN (DRAPES) ×3 IMPLANT
COVER MAYO STAND STRL (DRAPES) ×3 IMPLANT
COVER PROBE W GEL 5X96 (DRAPES) ×3 IMPLANT
DECANTER SPIKE VIAL GLASS SM (MISCELLANEOUS) IMPLANT
DEVICE DUBIN W/COMP PLATE 8390 (MISCELLANEOUS) ×3 IMPLANT
DRAPE LAPAROTOMY 100X72 PEDS (DRAPES) ×3 IMPLANT
DRAPE UTILITY XL STRL (DRAPES) ×3 IMPLANT
ELECT REM PT RETURN 9FT ADLT (ELECTROSURGICAL) ×3
ELECTRODE REM PT RTRN 9FT ADLT (ELECTROSURGICAL) ×1 IMPLANT
GLOVE BIO SURGEON STRL SZ 6.5 (GLOVE) ×2 IMPLANT
GLOVE BIO SURGEONS STRL SZ 6.5 (GLOVE) ×1
GLOVE BIOGEL PI IND STRL 7.0 (GLOVE) ×1 IMPLANT
GLOVE BIOGEL PI INDICATOR 7.0 (GLOVE) ×2
GLOVE EXAM NITRILE EXT CUFF MD (GLOVE) ×3 IMPLANT
GLOVE SURG SIGNA 7.5 PF LTX (GLOVE) ×3 IMPLANT
GOWN STRL REUS W/ TWL LRG LVL3 (GOWN DISPOSABLE) ×1 IMPLANT
GOWN STRL REUS W/ TWL XL LVL3 (GOWN DISPOSABLE) ×1 IMPLANT
GOWN STRL REUS W/TWL LRG LVL3 (GOWN DISPOSABLE) ×2
GOWN STRL REUS W/TWL XL LVL3 (GOWN DISPOSABLE) ×2
KIT MARKER MARGIN INK (KITS) ×3 IMPLANT
LIQUID BAND (GAUZE/BANDAGES/DRESSINGS) ×3 IMPLANT
NEEDLE HYPO 25X1 1.5 SAFETY (NEEDLE) ×3 IMPLANT
NS IRRIG 1000ML POUR BTL (IV SOLUTION) ×3 IMPLANT
PACK BASIN DAY SURGERY FS (CUSTOM PROCEDURE TRAY) ×3 IMPLANT
PENCIL BUTTON HOLSTER BLD 10FT (ELECTRODE) ×3 IMPLANT
SLEEVE SCD COMPRESS KNEE MED (MISCELLANEOUS) ×3 IMPLANT
SPONGE GAUZE 4X4 12PLY STER LF (GAUZE/BANDAGES/DRESSINGS) IMPLANT
SPONGE LAP 4X18 X RAY DECT (DISPOSABLE) ×3 IMPLANT
SUT MNCRL AB 4-0 PS2 18 (SUTURE) ×3 IMPLANT
SUT SILK 2 0 SH (SUTURE) IMPLANT
SUT VIC AB 3-0 SH 27 (SUTURE) ×2
SUT VIC AB 3-0 SH 27X BRD (SUTURE) ×1 IMPLANT
SYR CONTROL 10ML LL (SYRINGE) ×3 IMPLANT
TOWEL OR 17X24 6PK STRL BLUE (TOWEL DISPOSABLE) ×3 IMPLANT
TOWEL OR NON WOVEN STRL DISP B (DISPOSABLE) IMPLANT
TUBE CONNECTING 20'X1/4 (TUBING)
TUBE CONNECTING 20X1/4 (TUBING) IMPLANT
YANKAUER SUCT BULB TIP NO VENT (SUCTIONS) IMPLANT

## 2015-12-23 NOTE — Op Note (Signed)
RIGHT BREAST LUMPECTOMY WITH RADIOACTIVE SEED LOCALIZATION  Procedure Note  [name] 12/23/2015   Pre-op Diagnosis: Right breast mass     Post-op Diagnosis: same  Procedure(s): RIGHT BREAST LUMPECTOMY WITH RADIOACTIVE SEED LOCALIZATION  Surgeon(s): Coralie Keens, MD  Anesthesia: General  Staff:  Circulator: Ted Mcalpine, RN Scrub Person: Eston Esters, RN  Estimated Blood Loss: Minimal               Specimens: sent to path          [facility]   Date: 12/23/2015  Time: 10:02 AM

## 2015-12-23 NOTE — Transfer of Care (Signed)
Immediate Anesthesia Transfer of Care Note  Patient: Sarah House  Procedure(s) Performed: Procedure(s): RIGHT BREAST LUMPECTOMY WITH RADIOACTIVE SEED LOCALIZATION (Right)  Patient Location: PACU  Anesthesia Type:General  Level of Consciousness: awake, alert  and patient cooperative  Airway & Oxygen Therapy: Patient Spontanous Breathing and Patient connected to face mask oxygen  Post-op Assessment: Report given to RN, Post -op Vital signs reviewed and stable and Patient moving all extremities  Post vital signs: Reviewed and stable  Last Vitals:  Filed Vitals:   12/23/15 0800  BP: 144/70  Pulse: 78  Temp: 36.8 C  Resp: 20    Complications: No apparent anesthesia complications

## 2015-12-23 NOTE — Anesthesia Postprocedure Evaluation (Signed)
Anesthesia Post Note  Patient: Sarah House  Procedure(s) Performed: Procedure(s) (LRB): RIGHT BREAST LUMPECTOMY WITH RADIOACTIVE SEED LOCALIZATION (Right)  Patient location during evaluation: PACU Anesthesia Type: General Level of consciousness: awake and alert Pain management: pain level controlled Vital Signs Assessment: post-procedure vital signs reviewed and stable Respiratory status: spontaneous breathing, nonlabored ventilation and respiratory function stable Cardiovascular status: blood pressure returned to baseline and stable Postop Assessment: no signs of nausea or vomiting Anesthetic complications: no    Last Vitals:  Filed Vitals:   12/23/15 1051 12/23/15 1114  BP:  154/78  Pulse: 73 63  Temp:  36.5 C  Resp: 15 20    Last Pain:  Filed Vitals:   12/23/15 1116  PainSc: 3                  [name]

## 2015-12-23 NOTE — Anesthesia Procedure Notes (Signed)
Procedure Name: LMA Insertion Date/Time: 12/23/2015 9:38 AM Performed by: Baxter Flattery Pre-anesthesia Checklist: Patient identified, Emergency Drugs available, Suction available and Patient being monitored Patient Re-evaluated:Patient Re-evaluated prior to inductionOxygen Delivery Method: Circle System Utilized Preoxygenation: Pre-oxygenation with 100% oxygen Intubation Type: IV induction Ventilation: Mask ventilation without difficulty LMA: LMA inserted LMA Size: 4.0 Number of attempts: 1 Airway Equipment and Method: Bite block Placement Confirmation: positive ETCO2 and breath sounds checked- equal and bilateral Tube secured with: Tape Dental Injury: Teeth and Oropharynx as per pre-operative assessment

## 2015-12-23 NOTE — Discharge Instructions (Signed)
[facility] °Office Phone Number [phone] ° °BREAST BIOPSY/ PARTIAL MASTECTOMY: POST OP INSTRUCTIONS ° °Always review your discharge instruction sheet given to you by the facility where your surgery was performed. ° °IF YOU HAVE DISABILITY OR FAMILY LEAVE FORMS, YOU MUST BRING THEM TO THE OFFICE FOR PROCESSING.  DO NOT GIVE THEM TO YOUR DOCTOR. ° °1. A prescription for pain medication may be given to you upon discharge.  Take your pain medication as prescribed, if needed.  If narcotic pain medicine is not needed, then you may take acetaminophen (Tylenol) or ibuprofen (Advil) as needed. °2. Take your usually prescribed medications unless otherwise directed °3. If you need a refill on your pain medication, please contact your pharmacy.  They will contact our office to request authorization.  Prescriptions will not be filled after 5pm or on week-ends. °4. You should eat very light the first 24 hours after surgery, such as soup, crackers, pudding, etc.  Resume your normal diet the day after surgery. °5. Most patients will experience some swelling and bruising in the breast.  Ice packs and a good support bra will help.  Swelling and bruising can take several days to resolve.  °6. It is common to experience some constipation if taking pain medication after surgery.  Increasing fluid intake and taking a stool softener will usually help or prevent this problem from occurring.  A mild laxative (Milk of Magnesia or Miralax) should be taken according to package directions if there are no bowel movements after 48 hours. °7. Unless discharge instructions indicate otherwise, you may remove your bandages 24-48 hours after surgery, and you may shower at that time.  You may have steri-strips (small skin tapes) in place directly over the incision.  These strips should be left on the skin for 7-10 days.  If your surgeon used skin glue on the incision, you may shower in 24 hours.  The glue will flake off over the  next 2-3 weeks.  Any sutures or staples will be removed at the office during your follow-up visit. °8. ACTIVITIES:  You may resume regular daily activities (gradually increasing) beginning the next day.  Wearing a good support bra or sports bra minimizes pain and swelling.  You may have sexual intercourse when it is comfortable. °a. You may drive when you no longer are taking prescription pain medication, you can comfortably wear a seatbelt, and you can safely maneuver your car and apply brakes. °b. RETURN TO WORK:  ______________________________________________________________________________________ °9. You should see your doctor in the office for a follow-up appointment approximately two weeks after your surgery.  Your doctor’s nurse will typically make your follow-up appointment when she calls you with your pathology report.  Expect your pathology report 2-3 business days after your surgery.  You may call to check if you do not hear from us after three days. °10. OTHER INSTRUCTIONS: _______________________________________________________________________________________________ _____________________________________________________________________________________________________________________________________ °_____________________________________________________________________________________________________________________________________ °_____________________________________________________________________________________________________________________________________ ° °WHEN TO CALL YOUR DOCTOR: °1. Fever over 101.0 °2. Nausea and/or vomiting. °3. Extreme swelling or bruising. °4. Continued bleeding from incision. °5. Increased pain, redness, or drainage from the incision. ° °The clinic staff is available to answer your questions during regular business hours.  Please don’t hesitate to call and ask to speak to one of the nurses for clinical concerns.  If you have a medical emergency, go to the nearest  emergency room or call 911.  A surgeon from [facility] is always on call at the hospital. ° °For further questions, please visit [email or web address]  ° ° ° °  Post Anesthesia Home Care Instructions ° °Activity: °Get plenty of rest for the remainder of the day. A responsible adult should stay with you for 24 hours following the procedure.  °For the next 24 hours, DO NOT: °-Drive a car °-Operate machinery °-Drink alcoholic beverages °-Take any medication unless instructed by your physician °-Make any legal decisions or sign important papers. ° °Meals: °Start with liquid foods such as gelatin or soup. Progress to regular foods as tolerated. Avoid greasy, spicy, heavy foods. If nausea and/or vomiting occur, drink only clear liquids until the nausea and/or vomiting subsides. Call your physician if vomiting continues. ° °Special Instructions/Symptoms: °Your throat may feel dry or sore from the anesthesia or the breathing tube placed in your throat during surgery. If this causes discomfort, gargle with warm salt water. The discomfort should disappear within 24 hours. ° °If you had a scopolamine patch placed behind your ear for the management of post- operative nausea and/or vomiting: ° °1. The medication in the patch is effective for 72 hours, after which it should be removed.  Wrap patch in a tissue and discard in the trash. Wash hands thoroughly with soap and water. °2. You may remove the patch earlier than 72 hours if you experience unpleasant side effects which may include dry mouth, dizziness or visual disturbances. °3. Avoid touching the patch. Wash your hands with soap and water after contact with the patch. °  ° °

## 2015-12-23 NOTE — Anesthesia Preprocedure Evaluation (Addendum)
Anesthesia Evaluation  Patient identified by MRN, date of birth, ID band Patient awake    Reviewed: Allergy & Precautions, NPO status , Patient's Chart, lab work & pertinent test results  Airway Mallampati: I  TM Distance: >3 FB Neck ROM: Full    Dental  (+) Teeth Intact, Dental Advisory Given   Pulmonary    breath sounds clear to auscultation       Cardiovascular hypertension, Pt. on medications  Rhythm:Regular Rate:Normal     Neuro/Psych    GI/Hepatic GERD  ,  Endo/Other  diabetes, Well Controlled, Type 2, Insulin DependentMorbid obesity  Renal/GU      Musculoskeletal   Abdominal   Peds  Hematology   Anesthesia Other Findings   Reproductive/Obstetrics                            Anesthesia Physical Anesthesia Plan  ASA: III  Anesthesia Plan: General   Post-op Pain Management:    Induction: Intravenous  Airway Management Planned: LMA  Additional Equipment:   Intra-op Plan:   Post-operative Plan: Extubation in OR  Informed Consent: I have reviewed the patients History and Physical, chart, labs and discussed the procedure including the risks, benefits and alternatives for the proposed anesthesia with the patient or authorized representative who has indicated his/her understanding and acceptance.   Dental advisory given  Plan Discussed with: CRNA, Anesthesiologist and Surgeon  Anesthesia Plan Comments:         Anesthesia Quick Evaluation

## 2015-12-23 NOTE — Interval H&P Note (Signed)
History and Physical Interval Note: no change in H and P  12/23/2015 8:23 AM  Sarah House  has presented today for surgery, with the diagnosis of Right breast mass  The various methods of treatment have been discussed with the patient and family. After consideration of risks, benefits and other options for treatment, the patient has consented to  Procedure(s): RIGHT BREAST LUMPECTOMY WITH RADIOACTIVE SEED LOCALIZATION (Right) as a surgical intervention .  The patient's history has been reviewed, patient examined, no change in status, stable for surgery.  I have reviewed the patient's chart and labs.  Questions were answered to the patient's satisfaction.     [name]

## 2015-12-24 ENCOUNTER — Encounter (HOSPITAL_BASED_OUTPATIENT_CLINIC_OR_DEPARTMENT_OTHER): Payer: Self-pay | Admitting: Surgery

## 2015-12-24 NOTE — Op Note (Signed)
NAMEMarland Kitchen  Sarah, House              ACCOUNT NO.:  [id number]  MEDICAL RECORD NO.:  [id number]  LOCATION:                                 FACILITY:  PHYSICIAN:  Coralie Keens, M.D. DATE OF BIRTH:  23-Oct-1956  DATE OF PROCEDURE:  12/23/2015 DATE OF DISCHARGE:                              OPERATIVE REPORT   PREOPERATIVE DIAGNOSIS:  Right breast mass.  POSTOPERATIVE DIAGNOSIS:  Right breast mass.  PROCEDURE:  Radioactive seed localized right breast lumpectomy.  SURGEON:  Coralie Keens, M.D.  ANESTHESIA:  General and 0.5% Marcaine.  ESTIMATED BLOOD LOSS:  Minimal.  INDICATIONS:  This is a 60 year old female who was found to have a right breast mass on recent mammography.  She has had multiple biopsies in the past in other areas.  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  Decision was made to proceed with radioactive seed localized right breast lumpectomy for histologic evaluation to rule out malignancy.  PROCEDURE IN DETAIL:  The patient was identified in the preoperative holding area.  The Neoprobe was used to confirm that the radioactive seed was indeed in the right breast.  She was then taken to the operating room.  She was placed in a supine position on the operating room table and general anesthesia was induced.  Her right breast was then prepped and draped in the usual sterile fashion.  I identified the area of increased uptake in the inner lower quadrant of the right breast.  I anesthetized the skin with Marcaine and then made a longitudinal incision with a scalpel.  I took this down to the breast tissue with the electrocautery.  With the aid of the Neoprobe, I did a wide lumpectomy around the area of increased uptake.  Once the specimen was removed, I confirmed that the seed was in place with the Neoprobe in the lumpectomy specimen.  I marked it with marker paint.  X-ray was performed and confirmed that the radioactive  seed and previous marker were in the biopsy specimen.  This was sent to Pathology for further evaluation.  I then achieved hemostasis with cautery in the incision.  I anesthetized the wound further with Marcaine.  I then closed the subcutaneous tissue with interrupted 3-0 Vicryl sutures and closed the skin with a running 4-0 Monocryl.  Skin glue was then applied.  The patient tolerated the procedure well.  All the counts were correct at the end of the procedure.  The patient was then extubated in the operating room and taken in a stable condition to the recovery room.     Coralie Keens, M.D.     DB/MEDQ  D:  12/23/2015  T:  12/23/2015  Job:  [id number]

## 2016-03-18 ENCOUNTER — Other Ambulatory Visit: Payer: Self-pay | Admitting: Gastroenterology

## 2016-03-18 ENCOUNTER — Ambulatory Visit (INDEPENDENT_AMBULATORY_CARE_PROVIDER_SITE_OTHER): Payer: Managed Care, Other (non HMO) | Admitting: Podiatry

## 2016-03-18 ENCOUNTER — Encounter: Payer: Self-pay | Admitting: Podiatry

## 2016-03-18 DIAGNOSIS — M79673 Pain in unspecified foot: Secondary | ICD-10-CM | POA: Diagnosis not present

## 2016-03-18 DIAGNOSIS — R1310 Dysphagia, unspecified: Secondary | ICD-10-CM

## 2016-03-18 DIAGNOSIS — B351 Tinea unguium: Secondary | ICD-10-CM

## 2016-03-18 NOTE — Progress Notes (Signed)
Subjective:     Patient ID: Sarah House, female   DOB: 11-11-1955, 60 y.o.   MRN: [id number]  HPI patient presents with nail disease and incurvation and pain distal 1-5 both feet with pain   Review of Systems     Objective:   Physical Exam Neurovascular status unchanged with thick brittle nailbeds 1-5 both feet that are incurvated and sore    Assessment:     Mycotic nail infections with pain 1-5 both feet    Plan:     Debris painful nailbeds 1-5 both feet and reappoint 3 months or earlier if needed

## 2016-03-24 ENCOUNTER — Ambulatory Visit
Admission: RE | Admit: 2016-03-24 | Discharge: 2016-03-24 | Disposition: A | Discharge status: Home / Self Care | Payer: Managed Care, Other (non HMO) | Source: Ambulatory Visit | Attending: Gastroenterology | Admitting: Gastroenterology

## 2016-03-24 DIAGNOSIS — R1310 Dysphagia, unspecified: Secondary | ICD-10-CM

## 2016-03-29 ENCOUNTER — Other Ambulatory Visit: Payer: Self-pay | Admitting: Gastroenterology

## 2016-04-01 ENCOUNTER — Encounter (HOSPITAL_COMMUNITY): Payer: Self-pay | Admitting: *Deleted

## 2016-04-08 NOTE — H&P (Signed)
  Sarah House HPI: This 60 year old black female presents to the office for a further evaluation of trouble swallowing. Pateint was treated for esophageal candidiasis in the past and she feels she is having recurrent symptoms now. She has trouble swallowing certain solid foods. She feels some food gets impacted in her mid-esophagus and feels the need to regurgitate to dislodge the food bolus. She denies any trouble swallowing pills or liquids. She continues to take Omeprazole with good control of her acid reflux. She averages  1 BM per day with no obvious blood or mucus in the stool. She has bouts of constipation where she goes 2 days without a BM.  She has good appetite and her weight has been stable. She denies having any complaints of  nausea, vomiting, odynophagia. She denies having a family history of colon cancer, celiac sprue or IBD. She had a normal colonoscopy done on 03/21/2013.  Past Medical History  Diagnosis Date  . Hypertension   . Diabetes mellitus without complication ([place])   . GERD (gastroesophageal reflux disease)   . Arthritis     knees  . Hyperthyroidism     had "radiation to kill it"  . Anemia     Past Surgical History  Procedure Laterality Date  . Knee arthroscopy    . Breast lumpectomy with radioactive seed localization Right 12/23/2015    Procedure: RIGHT BREAST LUMPECTOMY WITH RADIOACTIVE SEED LOCALIZATION;  Surgeon: Coralie Keens, MD;  Location: [facility];  Service: General;  Laterality: Right;  . Colonoscopy w/ polypectomy      History reviewed. No pertinent family history.  Social History:  reports that she has never smoked. She does not have any smokeless tobacco history on file. She reports that she does not drink alcohol or use illicit drugs.  Allergies: No Known Allergies  Medications: Scheduled: Continuous:  No results found for this or any previous visit (from the past 24 hour(s)).   No results found.  ROS:  As stated above  in the HPI otherwise negative.  There were no vitals taken for this visit.    PE: Gen: NAD, Alert and Oriented HEENT:  [place]/AT, EOMI Neck: Supple, no LAD Lungs: CTA Bilaterally CV: RRR without M/G/R ABM: Soft, NTND, +BS Ext: No C/C/E  Assessment/Plan: 1) Dysphagia - Esophagram revealed a small hiatal hernia with possible stricture. 2) IDA - Her HGB is at 10 with a low ferritin.  EGD/Colonoscopy.  [name] 04/08/2016, 11:19 AM

## 2016-04-09 ENCOUNTER — Encounter (HOSPITAL_COMMUNITY)
Admission: RE | Disposition: A | Discharge status: Home / Self Care | Payer: Self-pay | Source: Ambulatory Visit | Attending: Gastroenterology

## 2016-04-09 ENCOUNTER — Encounter (HOSPITAL_COMMUNITY): Payer: Self-pay | Admitting: *Deleted

## 2016-04-09 ENCOUNTER — Ambulatory Visit (HOSPITAL_COMMUNITY): Payer: Managed Care, Other (non HMO) | Admitting: Anesthesiology

## 2016-04-09 ENCOUNTER — Ambulatory Visit (HOSPITAL_COMMUNITY)
Admission: RE | Admit: 2016-04-09 | Discharge: 2016-04-09 | Disposition: A | Discharge status: Home / Self Care | Payer: Managed Care, Other (non HMO) | Source: Ambulatory Visit | Attending: Gastroenterology | Admitting: Gastroenterology

## 2016-04-09 DIAGNOSIS — Z7982 Long term (current) use of aspirin: Secondary | ICD-10-CM | POA: Diagnosis not present

## 2016-04-09 DIAGNOSIS — Z79899 Other long term (current) drug therapy: Secondary | ICD-10-CM | POA: Insufficient documentation

## 2016-04-09 DIAGNOSIS — Z794 Long term (current) use of insulin: Secondary | ICD-10-CM | POA: Insufficient documentation

## 2016-04-09 DIAGNOSIS — D509 Iron deficiency anemia, unspecified: Secondary | ICD-10-CM | POA: Diagnosis present

## 2016-04-09 DIAGNOSIS — K222 Esophageal obstruction: Secondary | ICD-10-CM | POA: Insufficient documentation

## 2016-04-09 DIAGNOSIS — I10 Essential (primary) hypertension: Secondary | ICD-10-CM | POA: Insufficient documentation

## 2016-04-09 DIAGNOSIS — E119 Type 2 diabetes mellitus without complications: Secondary | ICD-10-CM | POA: Diagnosis not present

## 2016-04-09 DIAGNOSIS — K449 Diaphragmatic hernia without obstruction or gangrene: Secondary | ICD-10-CM | POA: Diagnosis not present

## 2016-04-09 DIAGNOSIS — Z6841 Body Mass Index (BMI) 40.0 and over, adult: Secondary | ICD-10-CM | POA: Insufficient documentation

## 2016-04-09 DIAGNOSIS — K219 Gastro-esophageal reflux disease without esophagitis: Secondary | ICD-10-CM | POA: Diagnosis not present

## 2016-04-09 HISTORY — PX: COLONOSCOPY WITH PROPOFOL: SHX5780

## 2016-04-09 HISTORY — DX: Anemia, unspecified: D64.9

## 2016-04-09 HISTORY — PX: ESOPHAGOGASTRODUODENOSCOPY (EGD) WITH PROPOFOL: SHX5813

## 2016-04-09 LAB — GLUCOSE, CAPILLARY: GLUCOSE-CAPILLARY: 111 mg/dL — AB (ref 65–99)

## 2016-04-09 SURGERY — ESOPHAGOGASTRODUODENOSCOPY (EGD) WITH PROPOFOL
Anesthesia: Monitor Anesthesia Care

## 2016-04-09 MED ORDER — PROPOFOL 500 MG/50ML IV EMUL
INTRAVENOUS | Status: DC | PRN
  Administered 2016-04-09: 60 mg via INTRAVENOUS

## 2016-04-09 MED ORDER — PROPOFOL 10 MG/ML IV BOLUS
INTRAVENOUS | Status: AC
  Filled 2016-04-09: qty 20

## 2016-04-09 MED ORDER — PROPOFOL 10 MG/ML IV BOLUS
INTRAVENOUS | Status: AC
  Filled 2016-04-09: qty 40

## 2016-04-09 MED ORDER — LACTATED RINGERS IV SOLN
INTRAVENOUS | Status: DC
  Administered 2016-04-09: 13:00:00 via INTRAVENOUS

## 2016-04-09 MED ORDER — SODIUM CHLORIDE 0.9 % IV SOLN: INTRAVENOUS | Status: DC

## 2016-04-09 MED ORDER — PROPOFOL 500 MG/50ML IV EMUL
INTRAVENOUS | Status: DC | PRN
  Administered 2016-04-09: 125 ug/kg/min via INTRAVENOUS

## 2016-04-09 SURGICAL SUPPLY — 24 items

## 2016-04-09 NOTE — Discharge Instructions (Signed)
Esophagogastroduodenoscopy, Care After Refer to this sheet in the next few weeks. These instructions provide you with information about caring for yourself after your procedure. Your health care provider may also give you more specific instructions. Your treatment has been planned according to current medical practices, but problems sometimes occur. Call your health care provider if you have any problems or questions after your procedure. WHAT TO EXPECT AFTER THE PROCEDURE After your procedure, it is typical to feel:  Soreness in your throat.  Pain with swallowing.  Sick to your stomach (nauseous).  Bloated.  Dizzy.  Fatigued. HOME CARE INSTRUCTIONS  Do not eat or drink anything until the numbing medicine (local anesthetic) has worn off and your gag reflex has returned. You will know that the local anesthetic has worn off when you can swallow comfortably.  Do not drive or operate machinery until directed by your health care provider.  Take medicines only as directed by your health care provider. SEEK MEDICAL CARE IF:   You cannot stop coughing.  You are not urinating at all or less than usual. SEEK IMMEDIATE MEDICAL CARE IF:  You have difficulty swallowing.  You cannot eat or drink.  You have worsening throat or chest pain.  You have dizziness or lightheadedness or you faint.  You have nausea or vomiting.  You have chills.  You have a fever.  You have severe abdominal pain.  You have black, tarry, or bloody stools.   This information is not intended to replace advice given to you by your health care provider. Make sure you discuss any questions you have with your health care provider.   Document Released: 09/27/2012 Document Revised: 11/01/2014 Document Reviewed: 09/27/2012 Elsevier Interactive Patient Education 2016 [employer]. Colonoscopy, Care After These instructions give you information on caring for yourself after your procedure. Your doctor may also give  you more specific instructions. Call your doctor if you have any problems or questions after your procedure. HOME CARE  Do not drive for 24 hours.  Do not sign important papers or use machinery for 24 hours.  You may shower.  You may go back to your usual activities, but go slower for the first 24 hours.  Take rest breaks often during the first 24 hours.  Walk around or use warm packs on your belly (abdomen) if you have belly cramping or gas.  Drink enough fluids to keep your pee (urine) clear or pale yellow.  Resume your normal diet. Avoid heavy or fried foods.  Avoid drinking alcohol for 24 hours or as told by your doctor.  Only take medicines as told by your doctor. If a tissue sample (biopsy) was taken during the procedure:   Do not take aspirin or blood thinners for 7 days, or as told by your doctor.  Do not drink alcohol for 7 days, or as told by your doctor.  Eat soft foods for the first 24 hours. GET HELP IF: You still have a small amount of blood in your poop (stool) 2-3 days after the procedure. GET HELP RIGHT AWAY IF:  You have more than a small amount of blood in your poop.  You see clumps of tissue (blood clots) in your poop.  Your belly is puffy (swollen).  You feel sick to your stomach (nauseous) or throw up (vomit).  You have a fever.  You have belly pain that gets worse and medicine does not help. MAKE SURE YOU:  Understand these instructions.  Will watch your condition.  Will  get help right away if you are not doing well or get worse.   This information is not intended to replace advice given to you by your health care provider. Make sure you discuss any questions you have with your health care provider.   Document Released: 11/13/2010 Document Revised: 10/16/2013 Document Reviewed: 06/18/2013 Elsevier Interactive Patient Education [employer].

## 2016-04-09 NOTE — Anesthesia Preprocedure Evaluation (Addendum)
Anesthesia Evaluation  Patient identified by MRN, date of birth, ID band Patient awake    Reviewed: Allergy & Precautions, H&P , NPO status , Patient's Chart, lab work & pertinent test results  Airway Mallampati: II  TM Distance: >3 FB Neck ROM: Full    Dental no notable dental hx. (+) Teeth Intact, Dental Advisory Given   Pulmonary neg pulmonary ROS,    Pulmonary exam normal breath sounds clear to auscultation       Cardiovascular hypertension, Pt. on medications  Rhythm:Regular Rate:Normal     Neuro/Psych negative neurological ROS  negative psych ROS   GI/Hepatic Neg liver ROS, GERD  Medicated and Controlled,  Endo/Other  diabetes, Insulin Dependent, Oral Hypoglycemic AgentsHyperthyroidism Morbid obesity  Renal/GU negative Renal ROS  negative genitourinary   Musculoskeletal  (+) Arthritis ,   Abdominal   Peds  Hematology negative hematology ROS (+) anemia ,   Anesthesia Other Findings   Reproductive/Obstetrics negative OB ROS                            Anesthesia Physical Anesthesia Plan  ASA: III  Anesthesia Plan: MAC   Post-op Pain Management:    Induction: Intravenous  Airway Management Planned: Nasal Cannula  Additional Equipment:   Intra-op Plan:   Post-operative Plan:   Informed Consent: I have reviewed the patients History and Physical, chart, labs and discussed the procedure including the risks, benefits and alternatives for the proposed anesthesia with the patient or authorized representative who has indicated his/her understanding and acceptance.   Dental advisory given  Plan Discussed with: CRNA  Anesthesia Plan Comments:         Anesthesia Quick Evaluation

## 2016-04-09 NOTE — Op Note (Signed)
[facility] Patient Name: Sarah House Procedure Date: 04/09/2016 MRN: [id number] Attending MD: Carol Ada , MD Date of Birth: Dec 14, 1955 CSN: [id number] Age: 60 Admit Type: Outpatient Procedure:                Upper GI endoscopy Indications:              Iron deficiency anemia, Dysphagia Providers:                Carol Ada, MD, Cleda Daub, RN, Corliss Parish, Technician, Herbie Drape, CRNA Referring MD:              Medicines:                Propofol per Anesthesia Complications:            No immediate complications. Estimated Blood Loss:     Estimated blood loss was minimal. Procedure:                Pre-Anesthesia Assessment:                           - Prior to the procedure, a History and Physical                            was performed, and patient medications and                            allergies were reviewed. The patient's tolerance of                            previous anesthesia was also reviewed. The risks                            and benefits of the procedure and the sedation                            options and risks were discussed with the patient.                            All questions were answered, and informed consent                            was obtained. Prior Anticoagulants: The patient has                            taken no previous anticoagulant or antiplatelet                            agents. ASA Grade Assessment: III - A patient with                            severe systemic disease. After reviewing the risks  and benefits, the patient was deemed in                            satisfactory condition to undergo the procedure.                           - Sedation was administered by an anesthesia                            professional. Deep sedation was attained.                           After obtaining informed consent, the endoscope was      passed under direct vision. Throughout the                            procedure, the patient's blood pressure, pulse, and                            oxygen saturations were monitored continuously. The                            [id number]) scope was introduced through the                            mouth, and advanced to the second part of duodenum.                            The upper GI endoscopy was accomplished without                            difficulty. The patient tolerated the procedure                            well. Scope In: Scope Out: Findings:      Two mild benign-appearing, intrinsic stenoses were found. And they were       traversed. A guidewire was placed and the scope was withdrawn. Dilation       was performed with a Savary dilator with no resistance at 18 mm.       Estimated blood loss was minimal.      The stomach was normal.      The examined duodenum was normal.      Initial inspection of the esophagus appeared patent. No overt evidence       of a stricture, but there was the suspicion of a Candidal esophagitis.       Over a guidewire an 18 mm Savary was inserted without resistance.       Reinspection revealed a cervical esophageal web that was dilated. In the       mid esophagus a 3-5 cm segment of mucosa was dilated with the expected       mucosal disruption. No evidence of any post procedureal crepitus. The       patient reports a "sore throat", but no problems with chest pain or SOB. Impression:               -  Benign-appearing esophageal stenoses. Dilated.                           - Normal stomach.                           - Normal examined duodenum.                           - No specimens collected. Moderate Sedation:      N/A- Per Anesthesia Care Recommendation:           - Patient has a contact number available for                            emergencies. The signs and symptoms of potential                            delayed complications  were discussed with the                            patient. Return to normal activities tomorrow.                            Written discharge instructions were provided to the                            patient.                           - NPO for 4 hours and then small sips of clear                            fluid.                           - Monitor in Endoscopy for another hour.                           - If fever, chest pain, SOB, or any worsening                            symptoms, return to the ER immediately. I discussed                            this course of action with the patient and her                            husband.                           - Continue present medications. Procedure Code(s):        --- Professional ---                           [phone], Esophagogastroduodenoscopy, flexible,  transoral; with insertion of guide wire followed by                            passage of dilator(s) through esophagus over guide                            wire Diagnosis Code(s):        --- Professional ---                           K22.2, Esophageal obstruction                           D50.9, Iron deficiency anemia, unspecified                           R13.10, Dysphagia, unspecified CPT copyright 2016 American Medical Association. All rights reserved. The codes documented in this report are preliminary and upon coder review may  be revised to meet current compliance requirements. Carol Ada, MD Carol Ada, MD 04/09/2016 1:54:42 PM This report has been signed electronically. Number of Addenda: 0

## 2016-04-09 NOTE — Transfer of Care (Signed)
Immediate Anesthesia Transfer of Care Note  Patient: Sarah House  Procedure(s) Performed: Procedure(s): ESOPHAGOGASTRODUODENOSCOPY (EGD) WITH PROPOFOL (N/A) COLONOSCOPY WITH PROPOFOL (N/A)  Patient Location: PACU  Anesthesia Type:MAC  Level of Consciousness: sedated, patient cooperative and responds to stimulation  Airway & Oxygen Therapy: Patient Spontanous Breathing and Patient connected to face mask oxygen  Post-op Assessment: Report given to RN and Post -op Vital signs reviewed and stable  Post vital signs: Reviewed and stable  Last Vitals:  Filed Vitals:   04/09/16 1231  BP: 112/54  Pulse: 80  Temp: 36.7 C  Resp: 80    Last Pain: There were no vitals filed for this visit.       Complications: No apparent anesthesia complications

## 2016-04-09 NOTE — Anesthesia Postprocedure Evaluation (Signed)
Anesthesia Post Note  Patient: Sarah House  Procedure(s) Performed: Procedure(s) (LRB): ESOPHAGOGASTRODUODENOSCOPY (EGD) WITH PROPOFOL (N/A) COLONOSCOPY WITH PROPOFOL (N/A)  Patient location during evaluation: PACU Anesthesia Type: MAC Level of consciousness: awake and alert Pain management: pain level controlled Vital Signs Assessment: post-procedure vital signs reviewed and stable Respiratory status: spontaneous breathing, nonlabored ventilation and respiratory function stable Cardiovascular status: stable and blood pressure returned to baseline Anesthetic complications: no    Last Vitals:  Filed Vitals:   04/09/16 1400 04/09/16 1410  BP: 139/57 130/56  Pulse: 75 73  Temp:    Resp: 12 14    Last Pain:  Filed Vitals:   04/09/16 1415  PainSc: 4                  Yonah Tangeman,W. EDMOND

## 2016-04-09 NOTE — Op Note (Signed)
[facility] Patient Name: Sarah House Procedure Date: 04/09/2016 MRN: [id number] Attending MD: Carol Ada , MD Date of Birth: 03-29-1956 CSN: [id number] Age: 60 Admit Type: Outpatient Procedure:                Colonoscopy Indications:              Iron deficiency anemia Providers:                Carol Ada, MD, Cleda Daub, RN, Corliss Parish, Technician, Herbie Drape, CRNA Referring MD:              Medicines:                Propofol per Anesthesia Complications:            No immediate complications. Estimated Blood Loss:     Estimated blood loss: none. Procedure:                Pre-Anesthesia Assessment:                           - Prior to the procedure, a History and Physical                            was performed, and patient medications and                            allergies were reviewed. The patient's tolerance of                            previous anesthesia was also reviewed. The risks                            and benefits of the procedure and the sedation                            options and risks were discussed with the patient.                            All questions were answered, and informed consent                            was obtained. Prior Anticoagulants: The patient has                            taken no previous anticoagulant or antiplatelet                            agents. ASA Grade Assessment: III - A patient with                            severe systemic disease. After reviewing the risks  and benefits, the patient was deemed in                            satisfactory condition to undergo the procedure.                           - Sedation was administered by an anesthesia                            professional. Deep sedation was attained.                           After obtaining informed consent, the colonoscope                            was passed under  direct vision. Throughout the                            procedure, the patient's blood pressure, pulse, and                            oxygen saturations were monitored continuously. The                            [id number]) scope was introduced through                            the anus and advanced to the the cecum, identified                            by appendiceal orifice and ileocecal valve. The                            colonoscopy was performed without difficulty. The                            patient tolerated the procedure well. The quality                            of the bowel preparation was good. The ileocecal                            valve, appendiceal orifice, and rectum were                            photographed. Findings:      The entire examined colon appeared normal. Impression:               - The entire examined colon is normal.                           - No specimens collected. Moderate Sedation:      N/A- Per Anesthesia Care Recommendation:           - Patient has a contact number available for  emergencies. The signs and symptoms of potential                            delayed complications were discussed with the                            patient. Return to normal activities tomorrow.                            Written discharge instructions were provided to the                            patient.                           - Resume previous diet.                           - Continue present medications.                           - Repeat colonoscopy in 10 years for surveillance. Procedure Code(s):        --- Professional ---                           [phone], Colonoscopy, flexible; diagnostic, including                            collection of specimen(s) by brushing or washing,                            when performed (separate procedure) Diagnosis Code(s):        --- Professional ---                            D50.9, Iron deficiency anemia, unspecified CPT copyright 2016 American Medical Association. All rights reserved. The codes documented in this report are preliminary and upon coder review may  be revised to meet current compliance requirements. Carol Ada, MD Carol Ada, MD 04/09/2016 1:30:25 PM This report has been signed electronically. Number of Addenda: 0

## 2016-04-12 ENCOUNTER — Encounter (HOSPITAL_COMMUNITY): Payer: Self-pay | Admitting: Gastroenterology

## 2016-06-14 ENCOUNTER — Encounter (HOSPITAL_COMMUNITY): Admission: RE | Payer: Self-pay | Source: Ambulatory Visit

## 2016-06-14 ENCOUNTER — Ambulatory Visit (HOSPITAL_COMMUNITY)
Admission: RE | Admit: 2016-06-14 | Payer: Managed Care, Other (non HMO) | Source: Ambulatory Visit | Admitting: Gastroenterology

## 2016-06-14 SURGERY — IMAGING PROCEDURE, GI TRACT, INTRALUMINAL, VIA CAPSULE
Anesthesia: LOCAL

## 2016-06-17 ENCOUNTER — Ambulatory Visit (INDEPENDENT_AMBULATORY_CARE_PROVIDER_SITE_OTHER): Payer: Managed Care, Other (non HMO) | Admitting: Podiatry

## 2016-06-17 DIAGNOSIS — B351 Tinea unguium: Secondary | ICD-10-CM

## 2016-06-17 DIAGNOSIS — M79673 Pain in unspecified foot: Secondary | ICD-10-CM | POA: Diagnosis not present

## 2016-06-17 NOTE — Progress Notes (Signed)
Subjective:     Patient ID: Sarah House, female   DOB: 1956/04/07, 60 y.o.   MRN: [id number]  HPI patient presents with thick nails 1-5 both feet that are hard to cut   Review of Systems     Objective:   Physical Exam Neurovascular status intact thick yellow brittle nailbeds 1-5 both feet that are painful and cannot be cut    Assessment:     Mycotic nail infection with pain 1-5 both feet    Plan:     Debris painful nailbeds 1-5 both feet with no iatrogenic bleeding noted

## 2016-06-21 ENCOUNTER — Encounter (HOSPITAL_COMMUNITY): Payer: Self-pay

## 2016-06-21 ENCOUNTER — Ambulatory Visit (HOSPITAL_COMMUNITY)
Admission: RE | Admit: 2016-06-21 | Discharge: 2016-06-21 | Disposition: A | Discharge status: Home / Self Care | Payer: Managed Care, Other (non HMO) | Source: Ambulatory Visit | Attending: Gastroenterology | Admitting: Gastroenterology

## 2016-06-21 ENCOUNTER — Encounter (HOSPITAL_COMMUNITY)
Admission: RE | Disposition: A | Discharge status: Home / Self Care | Payer: Self-pay | Source: Ambulatory Visit | Attending: Gastroenterology

## 2016-06-21 DIAGNOSIS — D509 Iron deficiency anemia, unspecified: Secondary | ICD-10-CM | POA: Diagnosis not present

## 2016-06-21 DIAGNOSIS — Z79899 Other long term (current) drug therapy: Secondary | ICD-10-CM | POA: Diagnosis not present

## 2016-06-21 DIAGNOSIS — Z7982 Long term (current) use of aspirin: Secondary | ICD-10-CM | POA: Insufficient documentation

## 2016-06-21 DIAGNOSIS — D649 Anemia, unspecified: Secondary | ICD-10-CM | POA: Diagnosis present

## 2016-06-21 DIAGNOSIS — Z794 Long term (current) use of insulin: Secondary | ICD-10-CM | POA: Insufficient documentation

## 2016-06-21 HISTORY — PX: GIVENS CAPSULE STUDY: SHX5432

## 2016-06-21 SURGERY — IMAGING PROCEDURE, GI TRACT, INTRALUMINAL, VIA CAPSULE
Anesthesia: LOCAL

## 2016-06-21 SURGICAL SUPPLY — 1 items: TOWEL COTTON PACK 4EA (MISCELLANEOUS) ×4 IMPLANT

## 2016-06-21 NOTE — Progress Notes (Signed)
Pt ingested capsule at 0820.  Instructions given and understanding verbalized

## 2016-06-22 ENCOUNTER — Encounter (HOSPITAL_COMMUNITY): Payer: Self-pay | Admitting: Gastroenterology

## 2016-09-23 ENCOUNTER — Ambulatory Visit (INDEPENDENT_AMBULATORY_CARE_PROVIDER_SITE_OTHER): Payer: Managed Care, Other (non HMO) | Admitting: Podiatry

## 2016-09-23 DIAGNOSIS — M79674 Pain in right toe(s): Secondary | ICD-10-CM

## 2016-09-23 DIAGNOSIS — B351 Tinea unguium: Secondary | ICD-10-CM

## 2016-09-23 DIAGNOSIS — M79675 Pain in left toe(s): Secondary | ICD-10-CM

## 2016-09-23 NOTE — Progress Notes (Signed)
Subjective:     Patient ID: Sarah House, female   DOB: 1956-07-23, 60 y.o.   MRN: [id number]  HPI patient presents with thick nails 1-5 both feet that are painful with patient being a long-term diabetic   Review of Systems     Objective:   Physical Exam Neurovascular status intact with incurvated thickened nailbeds 1-5 both feet    Assessment:     Mycotic nail infection 1-5 both feet with pain    Plan:     Debride nailbeds 1-5 both feet with no iatrogenic bleeding noted

## 2016-12-23 ENCOUNTER — Ambulatory Visit (INDEPENDENT_AMBULATORY_CARE_PROVIDER_SITE_OTHER): Payer: Self-pay | Admitting: Podiatry

## 2016-12-23 DIAGNOSIS — B351 Tinea unguium: Secondary | ICD-10-CM

## 2016-12-23 DIAGNOSIS — M79674 Pain in right toe(s): Secondary | ICD-10-CM

## 2016-12-23 DIAGNOSIS — M79675 Pain in left toe(s): Secondary | ICD-10-CM

## 2016-12-24 NOTE — Progress Notes (Signed)
Subjective:     Patient ID: Sarah House, female   DOB: 1956/09/16, 61 y.o.   MRN: [id number]  HPI long-term diabetic presents with thick brittle nailbeds 1-5 both feet that are painful and she cannot cut   Review of Systems     Objective:   Physical Exam Neurovascular status intact with yellow brittle nailbeds 1-5 both feet that are painful    Assessment:     Mycotic nail infection with pain 1-5 both feet    Plan:     Debris painful nailbeds 1-5 both feet with no iatrogenic bleeding noted

## 2017-03-24 ENCOUNTER — Ambulatory Visit (INDEPENDENT_AMBULATORY_CARE_PROVIDER_SITE_OTHER): Payer: Self-pay | Admitting: Podiatry

## 2017-03-24 DIAGNOSIS — B351 Tinea unguium: Secondary | ICD-10-CM

## 2017-03-24 DIAGNOSIS — M79675 Pain in left toe(s): Secondary | ICD-10-CM

## 2017-03-24 DIAGNOSIS — M79674 Pain in right toe(s): Secondary | ICD-10-CM

## 2017-03-24 NOTE — Progress Notes (Signed)
Subjective:    Patient ID: Sarah House, female   DOB: 61 y.o.   MRN: [id number]   HPI patient presents stating she is worried about the big toenail second toenail both feet that they've been getting incurvated increasingly sore and she thinks that they don't look the same    ROS      Objective:  Physical Exam neurovascular status intact with nail disease 1-5 both feet with deformity of nails 1 to both feet and pain and all nails     Assessment:   Moderate mycotic infection with pain and long-term diabetes with deformity of nailbeds      Plan:    Recommend continue trimming for deformity and debrided nailbeds 1 through 5 both feet with no iatrogenic bleeding noted

## 2017-06-23 ENCOUNTER — Ambulatory Visit (INDEPENDENT_AMBULATORY_CARE_PROVIDER_SITE_OTHER): Payer: Managed Care, Other (non HMO) | Admitting: Podiatry

## 2017-06-23 ENCOUNTER — Encounter: Payer: Self-pay | Admitting: Podiatry

## 2017-06-23 DIAGNOSIS — M79675 Pain in left toe(s): Secondary | ICD-10-CM | POA: Diagnosis not present

## 2017-06-23 DIAGNOSIS — E1140 Type 2 diabetes mellitus with diabetic neuropathy, unspecified: Secondary | ICD-10-CM

## 2017-06-23 DIAGNOSIS — E1149 Type 2 diabetes mellitus with other diabetic neurological complication: Secondary | ICD-10-CM

## 2017-06-23 DIAGNOSIS — B351 Tinea unguium: Secondary | ICD-10-CM

## 2017-06-23 DIAGNOSIS — M79674 Pain in right toe(s): Secondary | ICD-10-CM

## 2017-06-23 NOTE — Progress Notes (Signed)
Subjective:    Patient ID: Sarah House, female   DOB: 61 y.o.   MRN: [id number]   HPI patient presents with incurvated thickened nailbeds 1-5 both feet that are sore and make it hard to wear shoe gear comfortably    ROS      Objective:  Physical Exam neurovascular status intact with patient found to have nail disease 1-5 both feet with yellow brittle nailbeds that are painful and long-term history of diabetes     Assessment:   Mycotic nails with incurvation pain 1-5 both feet with long-term diabetes      Plan:    Debride painful nailbeds 1-5 both feet with no iatrogenic bleeding noted

## 2017-10-27 ENCOUNTER — Ambulatory Visit: Payer: Managed Care, Other (non HMO) | Admitting: Podiatry

## 2018-01-20 ENCOUNTER — Ambulatory Visit (INDEPENDENT_AMBULATORY_CARE_PROVIDER_SITE_OTHER): Payer: Managed Care, Other (non HMO) | Admitting: Podiatry

## 2018-01-20 ENCOUNTER — Encounter: Payer: Self-pay | Admitting: Podiatry

## 2018-01-20 DIAGNOSIS — B351 Tinea unguium: Secondary | ICD-10-CM | POA: Diagnosis not present

## 2018-01-20 DIAGNOSIS — M79675 Pain in left toe(s): Secondary | ICD-10-CM

## 2018-01-20 DIAGNOSIS — M79674 Pain in right toe(s): Secondary | ICD-10-CM

## 2018-01-20 DIAGNOSIS — E1149 Type 2 diabetes mellitus with other diabetic neurological complication: Secondary | ICD-10-CM | POA: Diagnosis not present

## 2018-01-20 DIAGNOSIS — E1140 Type 2 diabetes mellitus with diabetic neuropathy, unspecified: Secondary | ICD-10-CM

## 2018-01-20 NOTE — Progress Notes (Signed)
Subjective:   Patient ID: Sarah House, female   DOB: 62 y.o.   MRN: [id number]   HPI Diabetic with neuropathic disease presents with incurvated nailbeds 1-5 both feet with thickness that she cannot cut herself   ROS      Objective:  Physical Exam  Chronic mycotic nail infection with pain 1-5 both feet     Assessment:  Mycotic nail infection with pain 1-5 both feet     Plan:  Debride painful nailbeds 1-5 both feet with no iatrogenic bleeding noted

## 2018-03-24 ENCOUNTER — Encounter: Payer: Self-pay | Admitting: Registered"

## 2018-03-24 ENCOUNTER — Encounter: Payer: Managed Care, Other (non HMO) | Attending: Internal Medicine | Admitting: Registered"

## 2018-03-24 DIAGNOSIS — E1165 Type 2 diabetes mellitus with hyperglycemia: Secondary | ICD-10-CM

## 2018-03-24 DIAGNOSIS — Z713 Dietary counseling and surveillance: Secondary | ICD-10-CM | POA: Diagnosis present

## 2018-03-24 NOTE — Progress Notes (Signed)
Diabetes Self-Management Education  Visit Type: First/Initial  Appt. Start Time: 0945 Appt. End Time: 6222  03/24/2018  [name], identified by name and date of birth, is a 62 y.o. female with a diagnosis of Diabetes: Type 2.   ASSESSMENT This patient is accompanied in the office by her spouse. 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 Pt states since her husband retired and a lot of the orders came from his co-workers she does not bake as often now.  Pt states she has a hard time with movement due to knee pain. Pt states her doctor recommended knee replacement surgery after she loses weight. Pt states she has been doing weight watchers at her job, but it has not helped her lose weight.  Pt states she started taking fish oil because she heard omega 3 is good for you and she does not eat a lot of fish but stopped because she didn't know if it was beneficial.  Pt states she has enough energy for work (7 out 10) but is very tired after work and by 7:30 could easily go to sleep but stays up until 10. Pt states she watches TV in bed. Other issues that may contribute to low energy: thyroid (pt reports radiation tx for hyperthyroid) but her doctor states she does not need to have medication. Pt reports history of iron infusion. Pt states she has not had a vitamin D test.  Long term use of omeprazole and metformin may be a good reading to test B12?  Pt gets up at 6:20 am takes grandkids to school. Pt eats breakfast 10 am. Stress: "low" 2 out 10  Diabetes Self-Management Education - 03/24/18 1007      Visit Information   Visit Type  First/Initial      Initial Visit   Diabetes Type  Type 2    Are you currently following a meal plan?  No    Are you taking your medications as prescribed?  Yes    Date Diagnosed  5 yrs ago      Health Coping   How would you rate your overall health?  Good      Psychosocial Assessment   Patient  Belief/Attitude about Diabetes  Motivated to manage diabetes    How often do you need to have someone help you when you read instructions, pamphlets, or other written materials from your doctor or pharmacy?  1 - Never    What is the last grade level you completed in school?  2 yr college      Complications   Last HgB A1C per patient/outside source  7.7 %    How often do you check your blood sugar?  1-2 times/day    Fasting Blood glucose range (mg/dL)  70-129    Postprandial Blood glucose range (mg/dL)  >200 only 1 ppbg reading 02/27/18 1 hr after br 226    Number of hypoglycemic episodes per month  0    Number of hyperglycemic episodes per week  1    Can you tell when your blood sugar is high?  No    Have you had a dilated eye exam in the past 12 months?  Yes    Have you had a dental exam in the past 12 months?  Yes    Are you checking your feet?  Yes    How many days per week are you checking your feet?  7      Dietary Intake   Breakfast  bagel cheese, butter, coffee, vanilla cream (2-3) OR boiled eggs, coffee, banana OR oatmeal, raisins OR raisin bran crunch    Snack (morning)  things like veggie straws, tangerine, chips, raisins, PB crackers    Lunch  chicken, salad, crackers ranch dressing croutons OR PB & jelly sandwich,     Snack (afternoon)  same as above    Dinner  bbq (sugar free) chicken, spinach, 1 cup parboiled rice    Snack (evening)  ice cream, flavored pops, ice cream sandwich, creamscile    Beverage(s)  coffee, 64 oz, green tea (bags), sweet tea 1x week 32oz with meal      Exercise   Exercise Type  Light (walking / raking leaves)    How many days per week to you exercise?  2    How many minutes per day do you exercise?  30    Total minutes per week of exercise  60      Patient Education   Previous Diabetes Education  No    Disease state   Definition of diabetes, type 1 and 2, and the diagnosis of diabetes    Nutrition management   Role of diet in the treatment of  diabetes and the relationship between the three main macronutrients and blood glucose level;Carbohydrate counting;Food label reading, portion sizes and measuring food.    Physical activity and exercise   Role of exercise on diabetes management, blood pressure control and cardiac health.    Medications  Reviewed patients medication for diabetes, action, purpose, timing of dose and side effects.    Monitoring  Identified appropriate SMBG and/or A1C goals.    Chronic complications  Relationship between chronic complications and blood glucose control    Psychosocial adjustment  Role of stress on diabetes      Individualized Goals (developed by patient)   Nutrition  General guidelines for healthy choices and portions discussed    Physical Activity  Exercise 3-5 times per week    Medications  take my medication as prescribed    Monitoring   test my blood glucose as discussed    Reducing Risk  Other (comment) increase whole grains      Outcomes   Expected Outcomes  Demonstrated interest in learning. Expect positive outcomes    Future DMSE  4-6 wks    Program Status  Not Completed     Individualized Plan for Diabetes Self-Management Training:   Learning Objective:  Patient will have a greater understanding of diabetes self-management. Patient education plan is to attend individual and/or group sessions per assessed needs and concerns.   Patient Instructions  Look into Sarah House Metallurgist) Consider asking for additional labs to help identify what may be contributing to your fatigue: thyroid, vitamin D, B12 (may be affected by metformin and omeprazole) Consider taking vitamin B12 Consider taking the fish oil again May try Fairlife milk for more protein and lactose free dairy option  Consider having more whole grains: [email or web address] Aim for 2-3 Carb Choices per meal (30-45 grams)  Aim for 0-1 Carbs choices (15 grams) per snack if hungry  Include protein with your meals  and snacks Consider reading food labels for Total Carbohydrate Consider increasing your activity level by 1 day per week (3-4 times per week) as tolerated Consider checking blood sugar at alternate times per day as directed by MD  Continue taking medication  as directed by MD  When making changes to your  diet & life style, pay attention to how it is affecting:  Stamina (walking, etc)  Pain level  Energy level at night  Changes in blood sugar control   Expected Outcomes:  Demonstrated interest in learning. Expect positive outcomes  Education material provided: Food label handouts, A1C conversion sheet, My Plate, Snack sheet and Carbohydrate counting sheet  If problems or questions, patient to contact team via:  Phone  Future DSME appointment: 4-6 wks

## 2018-03-24 NOTE — Patient Instructions (Addendum)
Look into Sarah House) Consider asking for additional labs to help identify what may be contributing to your fatigue: thyroid, vitamin D, B12 (may be affected by metformin and omeprazole) Consider taking vitamin B12 Consider taking the fish oil again May try Fairlife milk for more protein and lactose free dairy option Calorieking.com website to check nutrition facts of products.  Consider having more whole grains: [email or web address] Aim for 2-3 Carb Choices per meal (30-45 grams)  Aim for 0-1 Carbs choices (15 grams) per snack if hungry  Include protein with your meals and snacks Consider reading food labels for Total Carbohydrate Consider increasing your activity level by 1 day per week (3-4 times per week) as tolerated Consider checking blood sugar at alternate times per day as directed by MD  Continue taking medication  as directed by MD  When making changes to your diet & life style, pay attention to how it is affecting:  Stamina (walking, etc)  Pain level  Energy level at night  Changes in blood sugar control

## 2018-05-05 ENCOUNTER — Ambulatory Visit: Payer: Managed Care, Other (non HMO) | Admitting: Registered"

## 2018-05-16 ENCOUNTER — Ambulatory Visit (INDEPENDENT_AMBULATORY_CARE_PROVIDER_SITE_OTHER): Payer: Managed Care, Other (non HMO) | Admitting: Sports Medicine

## 2018-05-16 DIAGNOSIS — E1140 Type 2 diabetes mellitus with diabetic neuropathy, unspecified: Secondary | ICD-10-CM

## 2018-05-16 DIAGNOSIS — M79674 Pain in right toe(s): Secondary | ICD-10-CM

## 2018-05-16 DIAGNOSIS — E1149 Type 2 diabetes mellitus with other diabetic neurological complication: Secondary | ICD-10-CM

## 2018-05-16 DIAGNOSIS — B351 Tinea unguium: Secondary | ICD-10-CM | POA: Diagnosis not present

## 2018-05-16 DIAGNOSIS — M79675 Pain in left toe(s): Secondary | ICD-10-CM

## 2018-05-16 NOTE — Progress Notes (Signed)
Subjective: Sarah House is a 62 y.o. female patient with history of diabetes who presents to office today complaining of long,mildly painful nails  while ambulating in shoes; unable to trim. Patient states that the glucose reading this morning was not recorded yet. Patient denies any new changes in medication or new problems.  Patient Active Problem List   Diagnosis Date Noted  . Uncontrolled type 2 diabetes mellitus with hyperglycemia ([place]) 03/24/2018   Current Outpatient Medications on File Prior to Visit  Medication Sig Dispense Refill  . aspirin EC 81 MG tablet Take 81 mg by mouth daily.    Marland Kitchen atorvastatin (LIPITOR) 20 MG tablet Take 20 mg by mouth daily.    . Calcium Carbonate-Vitamin D (CALCIUM 500 + D PO) Take 1 tablet by mouth daily.    . cyclobenzaprine (FLEXERIL) 10 MG tablet Take 10 mg by mouth 3 (three) times daily as needed for muscle spasms.     Marland Kitchen glimepiride (AMARYL) 4 MG tablet Take 8 mg by mouth daily with breakfast.    . Insulin Degludec (TRESIBA FLEXTOUCH) 200 UNIT/ML SOPN Inject into the skin.    Marland Kitchen insulin glargine (LANTUS) 100 UNIT/ML injection Inject 12-44 Units into the skin 2 (two) times daily. Take 12 units in the morning and 44 units at night.    Marland Kitchen ketoconazole (NIZORAL) 2 % cream Apply 1 application topically daily as needed for irritation.     Marland Kitchen losartan-hydrochlorothiazide (HYZAAR) 100-25 MG per tablet Take 1 tablet by mouth daily.    . meloxicam (MOBIC) 15 MG tablet Take 15 mg by mouth daily as needed for pain.     . metFORMIN (GLUCOPHAGE) 500 MG tablet Take 1,000 mg by mouth 2 (two) times daily with a meal.     . METROGEL 1 % gel Apply 1 application topically daily as needed (Apply to affected area.).     Marland Kitchen metroNIDAZOLE (METROCREAM) 0.75 % cream Apply 1 application topically daily as needed (Apply to affected area.).     Marland Kitchen Multiple Vitamin (MULTIVITAMIN WITH MINERALS) TABS tablet Take 1 tablet by mouth daily.    . Omega-3 Fatty Acids (FISH OIL) 1000 MG CAPS  Take 1,000 mg by mouth daily.     Marland Kitchen omeprazole (PRILOSEC) 10 MG capsule Take 10 mg by mouth daily.    Marland Kitchen OVER THE COUNTER MEDICATION Take 2 capsules by mouth 2 (two) times daily. Black Cherry Capsules     No current facility-administered medications on file prior to visit.    No Known Allergies  No results found for this or any previous visit (from the past 2160 hour(s)).  Objective: General: Patient is awake, alert, and oriented x 3 and in no acute distress.  Integument: Skin is warm, dry and supple bilateral. Nails are tender, long, thickened and  dystrophic with subungual debris, consistent with onychomycosis, 1-5 bilateral. No signs of infection. No open lesions or preulcerative lesions present bilateral. Remaining integument unremarkable.  Vasculature:  Dorsalis Pedis pulse 1/4 bilateral. Posterior Tibial pulse  1/4 bilateral.  Capillary fill time <3 sec 1-5 bilateral. No hair growth to the level of the digits. Temperature gradient within normal limits. + varicosities present bilateral. No edema present bilateral.   Neurology: The patient has intact sensation measured with a 5.07/10g Semmes Weinstein Monofilament at all pedal sites bilateral . Vibratory sensation diminished bilateral with tuning fork. No Babinski sign present bilateral.   Musculoskeletal: Asymptomatic pes planus pedal deformities noted bilateral. Muscular strength 5/5 in all lower extremity muscular groups bilateral  without pain on range of motion . No tenderness with calf compression bilateral.  Assessment and Plan: Problem List Items Addressed This Visit    None    Visit Diagnoses    Diabetic neuropathy with neurologic complication ([place])    -  Primary   Pain due to onychomycosis of toenails of both feet       Dermatophytosis of nail       Pain in toes of both feet         -Examined patient. -Discussed and educated patient on diabetic foot care, especially with  regards to the vascular, neurological and  musculoskeletal systems.  -Stressed the importance of good glycemic control and the detriment of not  controlling glucose levels in relation to the foot. -Mechanically debrided all nails 1-5 bilateral using sterile nail nipper and filed with dremel without incident  -Answered all patient questions -Patient to return  in 3 months for at risk foot care -Patient advised to call the office if any problems or questions arise in the meantime.  Landis Martins, DPM

## 2018-05-18 ENCOUNTER — Encounter: Payer: Managed Care, Other (non HMO) | Attending: Internal Medicine | Admitting: Registered"

## 2018-05-18 DIAGNOSIS — Z713 Dietary counseling and surveillance: Secondary | ICD-10-CM | POA: Insufficient documentation

## 2018-05-18 DIAGNOSIS — E1165 Type 2 diabetes mellitus with hyperglycemia: Secondary | ICD-10-CM

## 2018-05-18 NOTE — Patient Instructions (Signed)
See if your insurance benefits include counseling. Aim to develop tools to handle stress so you can have other ways to help bring down your stress. Continue creating balanced meals, smaller portions of fruit. Check your blood sugar after meals to see if they are working for you Use the carb counting yellow card to help you identify carbohydrates

## 2018-05-18 NOTE — Progress Notes (Signed)
Diabetes Self-Management Education  Visit Type: Follow-up  Appt. Start Time: 0900 Appt. End Time: 0930  05/18/2018  [name], identified by name and date of birth, is a 62 y.o. female with a diagnosis of Diabetes: Type 2.   ASSESSMENT Pt brought a food log and had questions about identifying carbohydrates in her meals and if it is too much. Pt states she is doing Weight Watchers and choosing foods based on points and has been eating a lot of fruit because they are free points and are in season. Pt states she has lost 3 lbs, but RD suspects some of her meals have been really spiking her blood sugar, due to the amount of fruit she is eating.   Pt states she craves chocolate and sweet treats when she is stressed. 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 Pt expressed concern that her employer would look down on her for seeking counseling for stress management. RD discussed confidentially she can expect when getting counseling.  Pt states she finds gardening very relaxing and looking forward to the expected good weather this weekend to get out in her garden.  Pt states she is interested in talking about sodium next visit and how to read food labels to know if she is getting too much sodium.  Diabetes Self-Management Education - 05/18/18 0900      Visit Information   Visit Type  Follow-up      Initial Visit   Diabetes Type  Type 2      Dietary Intake   Breakfast  eggs, cherries, cheese stick, coffee w cream    Snack (morning)  chocolate when stressed    Lunch  chicken, 1 c rice, 1/2 biscuit, pinto beans    Dinner  salad, dressing, watermellon, ice cream bar    Beverage(s)  coffee, water      Patient Education   Nutrition management   Carbohydrate counting      Individualized Goals (developed by patient)   Nutrition  General guidelines for healthy choices and portions discussed    Monitoring   test my blood glucose as discussed    Health Coping  ask for help with (comment) stress management      Outcomes   Expected Outcomes  Demonstrated interest in learning. Expect positive outcomes    Future DMSE  4-6 wks    Program Status  Completed      Subsequent Visit   Since your last visit have you experienced any weight changes?  Loss    Weight Loss (lbs)  3     Individualized Plan for Diabetes Self-Management Training:   Learning Objective:  Patient will have a greater understanding of diabetes self-management. Patient education plan is to attend individual and/or group sessions per assessed needs and concerns.    Patient Instructions  See if your insurance benefits include counseling. Aim to develop tools to handle stress so you can have other ways to help bring down your stress. Continue creating balanced meals, smaller portions of fruit. Check your blood sugar after meals to see if they are working for you Use the carb counting yellow card to help you identify carbohydrates   Expected Outcomes:  Demonstrated interest in learning. Expect positive outcomes  Education material provided: Carbohydrate counting sheet  If problems or questions, patient to contact team via:  Phone  Future DSME appointment: 4-6 wks

## 2018-05-29 ENCOUNTER — Other Ambulatory Visit: Payer: Self-pay | Admitting: Internal Medicine

## 2018-05-29 DIAGNOSIS — R410 Disorientation, unspecified: Secondary | ICD-10-CM

## 2018-05-29 DIAGNOSIS — G4452 New daily persistent headache (NDPH): Secondary | ICD-10-CM

## 2018-06-09 ENCOUNTER — Ambulatory Visit
Admission: RE | Admit: 2018-06-09 | Discharge: 2018-06-09 | Disposition: A | Discharge status: Home / Self Care | Payer: Managed Care, Other (non HMO) | Source: Ambulatory Visit | Attending: Internal Medicine | Admitting: Internal Medicine

## 2018-06-09 DIAGNOSIS — G4452 New daily persistent headache (NDPH): Secondary | ICD-10-CM

## 2018-06-09 DIAGNOSIS — R410 Disorientation, unspecified: Secondary | ICD-10-CM

## 2018-06-29 ENCOUNTER — Ambulatory Visit: Payer: Managed Care, Other (non HMO) | Admitting: Registered"

## 2018-06-30 ENCOUNTER — Encounter: Payer: Managed Care, Other (non HMO) | Attending: Internal Medicine | Admitting: Registered"

## 2018-06-30 DIAGNOSIS — Z713 Dietary counseling and surveillance: Secondary | ICD-10-CM | POA: Diagnosis not present

## 2018-06-30 DIAGNOSIS — E1165 Type 2 diabetes mellitus with hyperglycemia: Secondary | ICD-10-CM

## 2018-06-30 NOTE — Progress Notes (Signed)
Diabetes Self-Management Education  Visit Type: Follow-up  Appt. Start Time: 0925 Appt. End Time: 4098  06/30/2018  [name], identified by name and date of birth, is a 62 y.o. female with a diagnosis of Diabetes: Type 2.   ASSESSMENT Pt states last week her new A1c is 8.8% was 7.7%. Pt states she has been having ice cream at night which helps her relax after a stressful day at work. RD suggested PT look into [place] Non-hunger eating class, pt agrees. Pt states she plans to talk to the employee resources at her job to see if she can get some help dealing with the aspects of her new role that she doesn't like. Pt states she has changed her diet back to having a lot of salads.  Pt FBG per meter avg 103 mg/dL. Pt has only checked one PPBG, after dinner 226 mg/dL.   Pt reports frequent headaches and CT scan 2-3 weeks ago didn't find anything. Pt states her MD first tried muscle relaxers, but changed to tramadol.    Pt states her sleep good, once she goes to sleep she is out. Pt states she does have muscle cramps in her legs at night.  Diabetes Self-Management Education - 06/30/18 0900      Visit Information   Visit Type  Follow-up      Initial Visit   Diabetes Type  Type 2      Dietary Intake   Breakfast  egg, protein drink    Snack (morning)  almonds OR banana    Lunch  salad, ritz crackers, chicken salad    Snack (afternoon)  strawberries OR veggie chips    Dinner  squash, green beans, chicken, beans    Snack (evening)  ~1.5 c ice cream, shakes,    Beverage(s)  coffee, water, crystal light       Patient Education   Nutrition management   Role of diet in the treatment of diabetes and the relationship between the three main macronutrients and blood glucose level    Psychosocial adjustment  Role of stress on diabetes      Individualized Goals (developed by patient)   Nutrition  General guidelines for healthy choices and portions discussed    Monitoring   test my  blood glucose as discussed      Outcomes   Expected Outcomes  Demonstrated interest in learning. Expect positive outcomes    Future DMSE  PRN    Program Status  Completed      Subsequent Visit   Since your last visit have you continued or begun to take your medications as prescribed?  Yes     Individualized Plan for Diabetes Self-Management Training:   Learning Objective:  Patient will have a greater understanding of diabetes self-management. Patient education plan is to attend individual and/or group sessions per assessed needs and concerns.  Patient Instructions  Consider trying Calm magnesium supplement, may help with muscle cramps, sleep, stress headaches. Salad with white beans is a great idea Include beans in your diet. Make sure to have protein whenever having carbohydrates such as fruit; nuts, eggs, cheese, etc. Consider the non-hunger eating class   Expected Outcomes:  Demonstrated interest in learning. Expect positive outcomes  Education material provided: Snack sheet, information for [place] non-hunger eating class.  If problems or questions, patient to contact team via:  Phone  Future DSME appointment: PRN

## 2018-06-30 NOTE — Patient Instructions (Addendum)
Consider trying Calm magnesium supplement, may help with muscle cramps, sleep, stress headaches. Salad with white beans is a great idea Include beans in your diet. Make sure to have protein whenever having carbohydrates such as fruit; nuts, eggs, cheese, etc. Consider the non-hunger eating class

## 2018-08-02 ENCOUNTER — Encounter (HOSPITAL_COMMUNITY): Payer: Self-pay | Admitting: Emergency Medicine

## 2018-08-02 ENCOUNTER — Emergency Department (HOSPITAL_COMMUNITY)
Admission: EM | Admit: 2018-08-02 | Discharge: 2018-08-02 | Disposition: A | Discharge status: Home / Self Care | Payer: Managed Care, Other (non HMO) | Attending: Emergency Medicine | Admitting: Emergency Medicine

## 2018-08-02 ENCOUNTER — Other Ambulatory Visit: Payer: Self-pay

## 2018-08-02 ENCOUNTER — Emergency Department (HOSPITAL_COMMUNITY): Payer: Managed Care, Other (non HMO)

## 2018-08-02 DIAGNOSIS — E119 Type 2 diabetes mellitus without complications: Secondary | ICD-10-CM | POA: Diagnosis not present

## 2018-08-02 DIAGNOSIS — Z79899 Other long term (current) drug therapy: Secondary | ICD-10-CM | POA: Insufficient documentation

## 2018-08-02 DIAGNOSIS — Z794 Long term (current) use of insulin: Secondary | ICD-10-CM | POA: Insufficient documentation

## 2018-08-02 DIAGNOSIS — E039 Hypothyroidism, unspecified: Secondary | ICD-10-CM | POA: Diagnosis not present

## 2018-08-02 DIAGNOSIS — I10 Essential (primary) hypertension: Secondary | ICD-10-CM | POA: Insufficient documentation

## 2018-08-02 DIAGNOSIS — R51 Headache: Secondary | ICD-10-CM | POA: Diagnosis not present

## 2018-08-02 DIAGNOSIS — G4489 Other headache syndrome: Secondary | ICD-10-CM

## 2018-08-02 DIAGNOSIS — Z7982 Long term (current) use of aspirin: Secondary | ICD-10-CM | POA: Insufficient documentation

## 2018-08-02 DIAGNOSIS — R42 Dizziness and giddiness: Secondary | ICD-10-CM | POA: Diagnosis present

## 2018-08-02 LAB — CBC
HCT: 40 % (ref 36.0–46.0)
Hemoglobin: 11.5 g/dL — ABNORMAL LOW (ref 12.0–15.0)
MCH: 24.5 pg — ABNORMAL LOW (ref 26.0–34.0)
MCHC: 28.8 g/dL — AB (ref 30.0–36.0)
MCV: 85.1 fL (ref 80.0–100.0)
NRBC: 0 % (ref 0.0–0.2)
PLATELETS: 450 10*3/uL — AB (ref 150–400)
RBC: 4.7 MIL/uL (ref 3.87–5.11)
RDW: 15.3 % (ref 11.5–15.5)
WBC: 12.1 10*3/uL — AB (ref 4.0–10.5)

## 2018-08-02 LAB — BASIC METABOLIC PANEL
Anion gap: 10 (ref 5–15)
BUN: 12 mg/dL (ref 8–23)
CO2: 28 mmol/L (ref 22–32)
CREATININE: 0.73 mg/dL (ref 0.44–1.00)
Calcium: 9.1 mg/dL (ref 8.9–10.3)
Chloride: 107 mmol/L (ref 98–111)
Glucose, Bld: 119 mg/dL — ABNORMAL HIGH (ref 70–99)
Potassium: 3.7 mmol/L (ref 3.5–5.1)
SODIUM: 145 mmol/L (ref 135–145)

## 2018-08-02 LAB — URINALYSIS, ROUTINE W REFLEX MICROSCOPIC
Bilirubin Urine: NEGATIVE
Glucose, UA: NEGATIVE mg/dL
Hgb urine dipstick: NEGATIVE
KETONES UR: NEGATIVE mg/dL
LEUKOCYTES UA: NEGATIVE
Nitrite: NEGATIVE
PROTEIN: NEGATIVE mg/dL
Specific Gravity, Urine: 1.02 (ref 1.005–1.030)
pH: 5 (ref 5.0–8.0)

## 2018-08-02 MED ORDER — DIAZEPAM 2 MG PO TABS
2.0000 mg | ORAL_TABLET | Freq: Once | ORAL | Status: AC
  Administered 2018-08-02: 2 mg via ORAL
  Filled 2018-08-02: qty 1

## 2018-08-02 MED ORDER — DIPHENHYDRAMINE HCL 50 MG/ML IJ SOLN
25.0000 mg | Freq: Once | INTRAMUSCULAR | Status: AC
Start: 2018-08-02 — End: 2018-08-02
  Administered 2018-08-02: 25 mg via INTRAMUSCULAR
  Filled 2018-08-02: qty 1

## 2018-08-02 MED ORDER — PROCHLORPERAZINE EDISYLATE 10 MG/2ML IJ SOLN
10.0000 mg | Freq: Once | INTRAMUSCULAR | Status: AC
  Administered 2018-08-02: 10 mg via INTRAMUSCULAR
  Filled 2018-08-02: qty 2

## 2018-08-02 NOTE — ED Triage Notes (Signed)
Onset July developed intermittent confusion, headache, hearing loss, and headache. CT of head completed in August. Seen doctor today for further evaluation. Alert answering and following commands appropriate. 3/10 achy headache.

## 2018-08-02 NOTE — ED Notes (Signed)
Patient verbalizes understanding of discharge instructions. Opportunity for questioning and answers were provided. Armband removed by staff, pt discharged from ED in wheelchair.  

## 2018-08-02 NOTE — ED Notes (Signed)
Patient transported to MRI 

## 2018-08-02 NOTE — ED Notes (Signed)
Pt returned from MRI °

## 2018-08-02 NOTE — ED Provider Notes (Signed)
[facility] EMERGENCY DEPARTMENT Provider Note   CSN: [id number] Arrival date & time: 08/02/18  1132     History   Chief Complaint Chief Complaint  Patient presents with  . Dizziness  . Altered Mental Status  . Hearing Problem  . Headache    HPI Sarah House is a 62 y.o. female.  62 yo F with a chief complaint of episodic headaches sensation that the world is spinning and confusion.  Is been going on for the past 4 months.  She has seen her family doctor for this.  He saw her today in the office and thought it was getting worse and so sent her here for an emergent MRI.  The history is provided by the patient.  Dizziness  Associated symptoms: headaches   Associated symptoms: no chest pain, no nausea, no palpitations, no shortness of breath and no vomiting   Altered Mental Status    Headache   Pertinent negatives include no fever, no palpitations, no shortness of breath, no nausea and no vomiting.  Illness  This is a new problem. The current episode started 2 days ago. The problem occurs constantly. The problem has not changed since onset.Associated symptoms include headaches. Pertinent negatives include no chest pain and no shortness of breath. Nothing aggravates the symptoms. Nothing relieves the symptoms. She has tried nothing for the symptoms. The treatment provided no relief.    Past Medical History:  Diagnosis Date  . Anemia   . Arthritis    knees  . Diabetes mellitus without complication ([place])   . GERD (gastroesophageal reflux disease)   . Hypertension   . Hyperthyroidism    had "radiation to kill it"    Patient Active Problem List   Diagnosis Date Noted  . Uncontrolled type 2 diabetes mellitus with hyperglycemia ([place]) 03/24/2018    Past Surgical History:  Procedure Laterality Date  . BREAST LUMPECTOMY WITH RADIOACTIVE SEED LOCALIZATION Right 12/23/2015   Procedure: RIGHT BREAST LUMPECTOMY WITH RADIOACTIVE SEED LOCALIZATION;  Surgeon:  Coralie Keens, MD;  Location: [facility];  Service: General;  Laterality: Right;  . COLONOSCOPY W/ POLYPECTOMY    . COLONOSCOPY WITH PROPOFOL N/A 04/09/2016   Procedure: COLONOSCOPY WITH PROPOFOL;  Surgeon: Carol Ada, MD;  Location: [facility];  Service: Endoscopy;  Laterality: N/A;  . ESOPHAGOGASTRODUODENOSCOPY (EGD) WITH PROPOFOL N/A 04/09/2016   Procedure: ESOPHAGOGASTRODUODENOSCOPY (EGD) WITH PROPOFOL;  Surgeon: Carol Ada, MD;  Location: [facility];  Service: Endoscopy;  Laterality: N/A;  . GIVENS CAPSULE STUDY N/A 06/21/2016   Procedure: GIVENS CAPSULE STUDY;  Surgeon: Juanita Craver, MD;  Location: [facility];  Service: Endoscopy;  Laterality: N/A;  . KNEE ARTHROSCOPY       OB History   None      Home Medications    Prior to Admission medications   Medication Sig Start Date End Date Taking? Authorizing Provider  amLODipine (NORVASC) 5 MG tablet Take 5 mg by mouth daily. 06/05/18  Yes [provider]  aspirin EC 81 MG tablet Take 81 mg by mouth daily.   Yes [provider]  atorvastatin (LIPITOR) 20 MG tablet Take 20 mg by mouth daily. 01/20/16  Yes [provider]  Calcium Carbonate-Vitamin D (CALCIUM 500 + D PO) Take 1 tablet by mouth daily.   Yes [provider]  cyclobenzaprine (FLEXERIL) 10 MG tablet Take 10 mg by mouth 3 (three) times daily as needed for muscle spasms.  03/06/16  Yes [provider]  glimepiride (AMARYL)  4 MG tablet Take 8 mg by mouth daily with breakfast.   Yes [provider]  Insulin Degludec (TRESIBA FLEXTOUCH) 200 UNIT/ML SOPN Inject 36 Units into the skin at bedtime.    Yes [provider]  losartan-hydrochlorothiazide (HYZAAR) 100-25 MG per tablet Take 1 tablet by mouth daily.   Yes [provider]  meloxicam (MOBIC) 15 MG tablet Take 15 mg by mouth daily as needed for pain.  02/09/16  Yes [provider]  metFORMIN (GLUCOPHAGE) 500 MG tablet Take 1,000  mg by mouth 2 (two) times daily with a meal.    Yes [provider]  METROGEL 1 % gel Apply 1 application topically daily as needed (Apply to affected area.).  02/03/16  Yes [provider]  Multiple Vitamin (MULTIVITAMIN WITH MINERALS) TABS tablet Take 1 tablet by mouth daily.   Yes [provider]  omeprazole (PRILOSEC) 10 MG capsule Take 10 mg by mouth daily.   Yes [provider]  tiZANidine (ZANAFLEX) 4 MG tablet Take 4 mg by mouth as needed. 05/03/18  Yes [provider]    Family History No family history on file.  Social History Social History   Tobacco Use  . Smoking status: Never Smoker  . Smokeless tobacco: Never Used  Substance Use Topics  . Alcohol use: No    Alcohol/week: 0.0 standard drinks  . Drug use: No     Allergies   Patient has no known allergies.   Review of Systems Review of Systems  Constitutional: Negative for chills and fever.  HENT: Negative for congestion and rhinorrhea.   Eyes: Negative for redness and visual disturbance.  Respiratory: Negative for shortness of breath and wheezing.   Cardiovascular: Negative for chest pain and palpitations.  Gastrointestinal: Negative for nausea and vomiting.  Genitourinary: Negative for dysuria and urgency.  Musculoskeletal: Negative for arthralgias and myalgias.  Skin: Negative for pallor and wound.  Neurological: Positive for dizziness and headaches.     Physical Exam Updated Vital Signs BP 129/67   Pulse 75   Temp 98.4 F (36.9 C) (Oral)   Resp 16   Ht 5\' 7"  (1.702 m)   Wt 136.1 kg   SpO2 99%   BMI 46.99 kg/m   Physical Exam  Constitutional: She is oriented to person, place, and time. She appears well-developed and well-nourished. No distress.  HENT:  Head: Normocephalic and atraumatic.  Eyes: Pupils are equal, round, and reactive to light. EOM are normal.  Neck: Normal range of motion. Neck supple.  Cardiovascular: Normal rate and regular rhythm.  Exam reveals no gallop and no friction rub.  No murmur heard. Pulmonary/Chest: Effort normal. She has no wheezes. She has no rales.  Abdominal: Soft. She exhibits no distension. There is no tenderness.  Musculoskeletal: She exhibits no edema or tenderness.  Neurological: She is alert and oriented to person, place, and time. She has normal strength. No cranial nerve deficit or sensory deficit. She displays a negative Romberg sign. Coordination and gait normal.  Benign neuro exam  Skin: Skin is warm and dry. She is not diaphoretic.  Psychiatric: She has a normal mood and affect. Her behavior is normal.  Nursing note and vitals reviewed.    ED Treatments / Results  Labs (all labs ordered are listed, but only abnormal results are displayed) Labs Reviewed  BASIC METABOLIC PANEL - Abnormal; Notable for the following components:      Result Value   Glucose, Bld 119 (*)    All  other components within normal limits  CBC - Abnormal; Notable for the following components:   WBC 12.1 (*)    Hemoglobin 11.5 (*)    MCH 24.5 (*)    MCHC 28.8 (*)    Platelets 450 (*)    All other components within normal limits  URINALYSIS, ROUTINE W REFLEX MICROSCOPIC    EKG EKG Interpretation  Date/Time:  Wednesday August 02 2018 11:42:07 EDT Ventricular Rate:  78 PR Interval:  176 QRS Duration: 88 QT Interval:  376 QTC Calculation: 428 R Axis:   26 Text Interpretation:   Poor data quality, interpretation may be adversely affected Normal sinus rhythm Cannot rule out Anterior infarct , age undetermined Abnormal ECG TECHNICALLY DIFFICULT Baseline wander Otherwise no significant change Confirmed by Deno Etienne [phone]) on 08/02/2018 3:54:37 PM   Radiology Mr Brain Wo Contrast  Result Date: 08/02/2018 CLINICAL DATA:  62 year old female with unexplained altered mental status. Since July intermittent confusion, headache, hearing loss. EXAM: MRI HEAD WITHOUT CONTRAST TECHNIQUE: Multiplanar, multiecho pulse  sequences of the brain and surrounding structures were obtained without intravenous contrast. COMPARISON:  Head CT without contrast 06/09/2018. FINDINGS: Brain: Cerebral volume is stable and within normal limits for age. No restricted diffusion to suggest acute infarction. No midline shift, mass effect, evidence of mass lesion, ventriculomegaly, extra-axial collection or acute intracranial hemorrhage. Cervicomedullary junction and pituitary are within normal limits. Pearline Cables and white matter signal is within normal limits for age throughout the brain. No cortical encephalomalacia or chronic cerebral blood products. The questioned bilateral occipital lobe hypodensity in August most likely was artifact. Vascular: Major intracranial vascular flow voids are preserved. The left vertebral artery appears dominant. Skull and upper cervical spine: Negative visible cervical spine. Visualized bone marrow signal is within normal limits. Sinuses/Orbits: Negative orbits soft tissues. Paranasal sinuses and mastoids are stable and well pneumatized. Other: Visible internal auditory structures appear normal. Normal stylomastoid foramina. Scalp and face soft tissues appear negative. IMPRESSION: No acute intracranial abnormality and normal for age noncontrast MRI appearance of the brain. Electronically Signed   By: Genevie Ann M.D.   On: 08/02/2018 19:37    Procedures Procedures (including critical care time)  Medications Ordered in ED Medications  prochlorperazine (COMPAZINE) injection 10 mg (10 mg Intramuscular Given 08/02/18 1634)  diphenhydrAMINE (BENADRYL) injection 25 mg (25 mg Intramuscular Given 08/02/18 1634)  diazepam (VALIUM) tablet 2 mg (2 mg Oral Given 08/02/18 1755)     Initial Impression / Assessment and Plan / ED Course  I have reviewed the triage vital signs and the nursing notes.  Pertinent labs & imaging results that were available during my care of the patient were reviewed by me and considered in my medical  decision making (see chart for details).     62 yo F with a chief complaint of episodic headaches and dizziness.  On my exam the patient has no neurologic deficits.  She was sent from the PCPs clinic for an MRI.  MRI here is negative.  Patient was given a headache cocktail without significant improvement.  We will give her neurology follow-up.  11:55 PM:  I have discussed the diagnosis/risks/treatment options with the patient and family and believe the pt to be eligible for discharge home to follow-up with PCP, neuro. We also discussed returning to the ED immediately if new or worsening sx occur. We discussed the sx which are most concerning (e.g., sudden worsening pain, fever, inability to tolerate by mouth) that necessitate immediate return. Medications administered to the patient  during their visit and any new prescriptions provided to the patient are listed below.  Medications given during this visit Medications  prochlorperazine (COMPAZINE) injection 10 mg (10 mg Intramuscular Given 08/02/18 1634)  diphenhydrAMINE (BENADRYL) injection 25 mg (25 mg Intramuscular Given 08/02/18 1634)  diazepam (VALIUM) tablet 2 mg (2 mg Oral Given 08/02/18 1755)      The patient appears reasonably screen and/or stabilized for discharge and I doubt any other medical condition or other [facility] requiring further screening, evaluation, or treatment in the ED at this time prior to discharge.    Final Clinical Impressions(s) / ED Diagnoses   Final diagnoses:  Headache syndrome    ED Discharge Orders         Ordered    Ambulatory referral to Neurology    Comments:  Increasing headaches and altered mental status   08/02/18 [place], [place], DO 08/02/18 2355

## 2018-08-08 ENCOUNTER — Encounter: Payer: Self-pay | Admitting: Neurology

## 2018-08-15 ENCOUNTER — Ambulatory Visit (INDEPENDENT_AMBULATORY_CARE_PROVIDER_SITE_OTHER): Payer: Managed Care, Other (non HMO) | Admitting: Podiatry

## 2018-08-15 ENCOUNTER — Encounter: Payer: Self-pay | Admitting: Podiatry

## 2018-08-15 DIAGNOSIS — R601 Generalized edema: Secondary | ICD-10-CM | POA: Diagnosis not present

## 2018-08-15 DIAGNOSIS — B351 Tinea unguium: Secondary | ICD-10-CM

## 2018-08-15 DIAGNOSIS — M79675 Pain in left toe(s): Secondary | ICD-10-CM | POA: Diagnosis not present

## 2018-08-15 DIAGNOSIS — M79674 Pain in right toe(s): Secondary | ICD-10-CM | POA: Diagnosis not present

## 2018-08-15 DIAGNOSIS — E1142 Type 2 diabetes mellitus with diabetic polyneuropathy: Secondary | ICD-10-CM

## 2018-08-15 NOTE — Patient Instructions (Signed)
Compression Stockings:  [employer] [place], [place] [address]  [phone]   Edema Edema is when you have too much fluid in your body or under your skin. Edema may make your legs, feet, and ankles swell up. Swelling is also common in looser tissues, like around your eyes. This is a common condition. It gets more common as you get older. There are many possible causes of edema. Eating too much salt (sodium) and being on your feet or sitting for a long time can cause edema in your legs, feet, and ankles. Hot weather may make edema worse. Edema is usually painless. Your skin may look swollen or shiny. Follow these instructions at home:  Keep the swollen body part raised (elevated) above the level of your heart when you are sitting or lying down.  Do not sit still or stand for a long time.  Do not wear tight clothes. Do not wear garters on your upper legs.  Exercise your legs. This can help the swelling go down.  Wear elastic bandages or support stockings as told by your doctor.  Eat a low-salt (low-sodium) diet to reduce fluid as told by your doctor.  Depending on the cause of your swelling, you may need to limit how much fluid you drink (fluid restriction).  Take over-the-counter and prescription medicines only as told by your doctor. Contact a doctor if:  Treatment is not working.  You have heart, liver, or kidney disease and have symptoms of edema.  You have sudden and unexplained weight gain. Get help right away if:  You have shortness of breath or chest pain.  You cannot breathe when you lie down.  You have pain, redness, or warmth in the swollen areas.  You have heart, liver, or kidney disease and get edema all of a sudden.  You have a fever and your symptoms get worse all of a sudden. Summary  Edema is when you have too much fluid in your body or under your skin.  Edema may make your legs, feet, and ankles swell up.  Swelling is also common in looser tissues, like around your eyes.  Raise (elevate) the swollen body part above the level of your heart when you are sitting or lying down.  Follow your doctor's instructions about diet and how much fluid you can drink (fluid restriction). This information is not intended to replace advice given to you by your health care provider. Make sure you discuss any questions you have with your health care provider. Document Released: 03/29/2008 Document Revised: 10/29/2016 Document Reviewed: 10/29/2016 Elsevier Interactive Patient Education  2017 [employer].

## 2018-08-15 NOTE — Progress Notes (Signed)
Subjective: Sarah House presents today with history of diabetic neuropathy with cc of painful, mycotic toenails.  Pain is aggravated when wearing enclosed shoe gear and relieved with periodic professional debridement.  She voices no new pedal concerns on today's visit.  Objective:  Vascular Examination: Capillary refill time <3 seconds x 10 digits Dorsalis pedis 1/4 b/l Posterior tibial pulses 1/4 b/l No digital hair x 10 digits Skin temperature gradient WNL b/l Bilateral pedal/ankle edema b/l  Dermatological Examination: Skin with normal turgor, texture and tone b/l Toenails 1-5 b/l discolored, thick, dystrophic with subungual debris and pain with palpation to nailbeds due to thickness of nails. No open wounds No interdigital maceration  Musculoskeletal: Muscle strength   Neurological: Sensation with 10 gram monofilament. Vibratory sensation   Assessment: 1. Painful onychomycosis toenails 1-5 b/l 2. NIDDM with Peripheral neuropathy 3. LE edema bilaterally  Plan: 1. Discussed lower extremity edema and need to minimize edema to prevent further complications such as ulcerations and cellulitis associated with chronic edema. Provided community vendor information for compression hose garments. Patient information on Edema dispensed on today. 2. Toenails 1-5 b/l were debrided in length and girth without iatrogenic bleeding. 3. Patient to continue soft, supportive shoe gear 4. Patient to report any pedal injuries to medical professional  5. Follow up 3 months. Patient/POA to call should there be a concern in the interim.

## 2018-08-16 ENCOUNTER — Encounter: Payer: Self-pay | Admitting: Diagnostic Neuroimaging

## 2018-08-16 ENCOUNTER — Ambulatory Visit (INDEPENDENT_AMBULATORY_CARE_PROVIDER_SITE_OTHER): Payer: Managed Care, Other (non HMO) | Admitting: Diagnostic Neuroimaging

## 2018-08-16 VITALS — BP 162/88 | HR 82 | Ht 67.0 in | Wt 299.0 lb

## 2018-08-16 DIAGNOSIS — R0683 Snoring: Secondary | ICD-10-CM

## 2018-08-16 DIAGNOSIS — I10 Essential (primary) hypertension: Secondary | ICD-10-CM

## 2018-08-16 DIAGNOSIS — G44209 Tension-type headache, unspecified, not intractable: Secondary | ICD-10-CM | POA: Diagnosis not present

## 2018-08-16 DIAGNOSIS — R5383 Other fatigue: Secondary | ICD-10-CM

## 2018-08-16 NOTE — Progress Notes (Signed)
[facility]  PATIENT: Sarah House DOB: January 27, 1956  REFERRING CLINICIAN: A Ramachandran HISTORY FROM: patient  REASON FOR VISIT: new consult    HISTORICAL  CHIEF COMPLAINT:  Chief Complaint  Patient presents with  . Headache    rm 6, New Pt, "mild brief headaches since July, happens sporadically, have not taken any OTC"    HISTORY OF PRESENT ILLNESS:   62 year old female here for evaluation of headaches since July 2019.  Patient reports intermittent brief migratory headaches affecting bilateral, frontal and occipital regions.  Symptoms may last 2 to 3 minutes at a time.  Sometimes they are associated with some mild confusion.  No nausea or vomiting.  No sensitivity to light or sound.  No throbbing sensation.  He describes a very mild aching sensation.  These may happen up to 2 times per day.  She does not take any medication for this.  Patient was evaluated by PCP, who sent patient to emergency room for evaluation and MRI.  MRI the brain was negative.  She has had issues with hypertension diabetes under suboptimal control.  No prior similar history of headaches.  Patient has been under more stress lately related to her job.  She has worked at the current job for the past 31 years.  However due to changes related to her job and increased stress factors patient decided to retire early.  She plans to finish her job in the next 1 month.  She is looking forward to retirement.   REVIEW OF SYSTEMS: Full 14 system review of systems performed and negative with exception of: Confusion headache numbness blurred vision hypertension diabetes hyperglyceridemia.  ALLERGIES: No Known Allergies  HOME MEDICATIONS: Outpatient Medications Prior to Visit  Medication Sig Dispense Refill  . amLODipine (NORVASC) 5 MG tablet Take 5 mg by mouth daily.    Marland Kitchen aspirin EC 81 MG tablet Take 81 mg by mouth daily.    Marland Kitchen atorvastatin (LIPITOR) 20 MG tablet Take 20 mg by mouth daily.    .  Calcium Carbonate-Vitamin D (CALCIUM 500 + D PO) Take 1 tablet by mouth daily.    . cyclobenzaprine (FLEXERIL) 10 MG tablet Take 10 mg by mouth 3 (three) times daily as needed for muscle spasms.     Marland Kitchen glimepiride (AMARYL) 4 MG tablet Take 8 mg by mouth daily with breakfast.    . Insulin Degludec (TRESIBA FLEXTOUCH) 200 UNIT/ML SOPN Inject 36 Units into the skin at bedtime.     . insulin lispro (HUMALOG KWIKPEN) 100 UNIT/ML KiwkPen Inject into the skin.    Marland Kitchen losartan-hydrochlorothiazide (HYZAAR) 100-25 MG per tablet Take 1 tablet by mouth daily.    . meloxicam (MOBIC) 15 MG tablet Take 15 mg by mouth daily as needed for pain.     . metFORMIN (GLUCOPHAGE) 500 MG tablet Take 1,000 mg by mouth 2 (two) times daily with a meal.     . METROGEL 1 % gel Apply 1 application topically daily as needed (Apply to affected area.).     Marland Kitchen Multiple Vitamin (MULTIVITAMIN WITH MINERALS) TABS tablet Take 1 tablet by mouth daily.    Marland Kitchen omeprazole (PRILOSEC) 10 MG capsule Take 10 mg by mouth daily.    Marland Kitchen tiZANidine (ZANAFLEX) 4 MG tablet Take 4 mg by mouth as needed.  0   No facility-administered medications prior to visit.     PAST MEDICAL HISTORY: Past Medical History:  Diagnosis Date  . Anemia   . Arthritis    knees  .  Diabetes mellitus without complication ([place])   . GERD (gastroesophageal reflux disease)   . Hypercholesterolemia   . Hypertension   . Hyperthyroidism    had "radiation to kill it"    PAST SURGICAL HISTORY: Past Surgical History:  Procedure Laterality Date  . BREAST LUMPECTOMY WITH RADIOACTIVE SEED LOCALIZATION Right 12/23/2015   Procedure: RIGHT BREAST LUMPECTOMY WITH RADIOACTIVE SEED LOCALIZATION;  Surgeon: Coralie Keens, MD;  Location: [facility];  Service: General;  Laterality: Right;  . COLONOSCOPY W/ POLYPECTOMY    . COLONOSCOPY WITH PROPOFOL N/A 04/09/2016   Procedure: COLONOSCOPY WITH PROPOFOL;  Surgeon: Carol Ada, MD;  Location: [facility];  Service:  Endoscopy;  Laterality: N/A;  . ESOPHAGOGASTRODUODENOSCOPY (EGD) WITH PROPOFOL N/A 04/09/2016   Procedure: ESOPHAGOGASTRODUODENOSCOPY (EGD) WITH PROPOFOL;  Surgeon: Carol Ada, MD;  Location: [facility];  Service: Endoscopy;  Laterality: N/A;  . GIVENS CAPSULE STUDY N/A 06/21/2016   Procedure: GIVENS CAPSULE STUDY;  Surgeon: Juanita Craver, MD;  Location: [facility];  Service: Endoscopy;  Laterality: N/A;  . KNEE ARTHROSCOPY      FAMILY HISTORY: Family History  Problem Relation Age of Onset  . Diabetes Mother   . Lymphoma Father   . Leukemia Maternal Uncle   . Cancer Paternal Grandfather        stomach  . Heart disease Sister     SOCIAL HISTORY: Social History   Socioeconomic History  . Marital status: Married    Spouse name: Herbie Baltimore  . Number of children: 1  . Years of education: 72  . Highest education level: Not on file  Occupational History    Comment: customer service rep [employer]  Social Needs  . Financial resource strain: Not on file  . Food insecurity:    Worry: Not on file    Inability: Not on file  . Transportation needs:    Medical: Not on file    Non-medical: Not on file  Tobacco Use  . Smoking status: Never Smoker  . Smokeless tobacco: Never Used  Substance and Sexual Activity  . Alcohol use: No    Alcohol/week: 0.0 standard drinks  . Drug use: No  . Sexual activity: Not on file  Lifestyle  . Physical activity:    Days per week: Not on file    Minutes per session: Not on file  . Stress: Not on file  Relationships  . Social connections:    Talks on phone: Not on file    Gets together: Not on file    Attends religious service: Not on file    Active member of club or organization: Not on file    Attends meetings of clubs or organizations: Not on file    Relationship status: Not on file  . Intimate partner violence:    Fear of current or ex partner: Not on file    Emotionally abused: Not on file    Physically abused: Not on file    Forced sexual  activity: Not on file  Other Topics Concern  . Not on file  Social History Narrative   Lives with husband   Caffeine- coffee 1 cup daily     PHYSICAL EXAM  GENERAL EXAM/CONSTITUTIONAL: Vitals:  Vitals:   08/16/18 1121  BP: (!) 162/88  Pulse: 82  Weight: 299 lb (135.6 kg)  Height: 5\' 7"  (1.702 m)     Body mass index is 46.83 kg/m. Wt Readings from Last 3 Encounters:  08/16/18 299 lb (135.6 kg)  08/02/18 300 lb (136.1 kg)  06/21/16 295 lb (133.8 kg)     Patient is in no distress; well developed, nourished and groomed; neck is supple  CARDIOVASCULAR:  Examination of carotid arteries is normal; no carotid bruits  Regular rate and rhythm, no murmurs  Examination of peripheral vascular system by observation and palpation is normal  EYES:  Ophthalmoscopic exam of optic discs and posterior segments is normal; no papilledema or hemorrhages  Visual Acuity Screening   Right eye Left eye Both eyes  Without correction:     With correction: 20/40 20/40   Comments: contacts    MUSCULOSKELETAL:  Gait, strength, tone, movements noted in Neurologic exam below  NEUROLOGIC: MENTAL STATUS:  No flowsheet data found.  awake, alert, oriented to person, place and time  recent and remote memory intact  normal attention and concentration  language fluent, comprehension intact, naming intact  fund of knowledge appropriate  CRANIAL NERVE:   2nd - no papilledema on fundoscopic exam  2nd, 3rd, 4th, 6th - pupils equal and reactive to light, visual fields full to confrontation, extraocular muscles intact, no nystagmus  5th - facial sensation symmetric  7th - facial strength symmetric  8th - hearing intact  9th - palate elevates symmetrically, uvula midline  11th - shoulder shrug symmetric  12th - tongue protrusion midline  MOTOR:   normal bulk and tone, full strength in the BUE, BLE  SENSORY:   normal and symmetric to light touch  COORDINATION:    finger-nose-finger, fine finger movements normal  REFLEXES:   deep tendon reflexes present and symmetric  GAIT/STATION:   narrow based gait; able to walk tandem     DIAGNOSTIC DATA (LABS, IMAGING, TESTING) - I reviewed patient records, labs, notes, testing and imaging myself where available.  Lab Results  Component Value Date   WBC 12.1 (H) 08/02/2018   HGB 11.5 (L) 08/02/2018   HCT 40.0 08/02/2018   MCV 85.1 08/02/2018   PLT 450 (H) 08/02/2018      Component Value Date/Time   NA 145 08/02/2018 1148   K 3.7 08/02/2018 1148   CL 107 08/02/2018 1148   CO2 28 08/02/2018 1148   GLUCOSE 119 (H) 08/02/2018 1148   BUN 12 08/02/2018 1148   CREATININE 0.73 08/02/2018 1148   CALCIUM 9.1 08/02/2018 1148   GFRNONAA >60 08/02/2018 1148   GFRAA >60 08/02/2018 1148   No results found for: CHOL, HDL, LDLCALC, LDLDIRECT, TRIG, CHOLHDL No results found for: HGBA1C No results found for: VITAMINB12 No results found for: TSH   08/02/18 MRI brain [I reviewed images myself and agree with interpretation. -[facility]]  - No acute intracranial abnormality and normal for age noncontrast MRI appearance of the brain.    ASSESSMENT AND PLAN  62 y.o. year old female here with new onset nonspecific headaches since July 2019.  MRI of the brain and neurologic examination are unremarkable.  Could represent mild tension or stress related headaches.  Recommend to optimize underlying diabetes and hypertension.  Use over-the-counter medicines as needed if symptoms are more severe or longer in duration.  Patient does report some symptoms of sleep apnea and therefore I will refer for sleep study.  Dx:  1. Tension headache   2. Snoring   3. Essential hypertension   4. Other fatigue       PLAN:  NON-SPECIFIC MIGRATORY HEADACHES (Stress? Tension?) - continue BP and DM control - use OTC meds as needed - optimize sleep, nutrition, exercise  SNORING, DAYTIME FATIGUE, HTN - check sleep  study  Orders Placed This Encounter  Procedures  . Ambulatory referral to Sleep Studies   Return if symptoms worsen or fail to improve.    Penni Bombard, MD 03/75/4360, 67:70 AM Certified in Neurology, Neurophysiology and Neuroimaging  [facility] [address], [place], [place] [address] [phone]

## 2018-08-26 ENCOUNTER — Encounter: Payer: Self-pay | Admitting: Podiatry

## 2018-09-08 ENCOUNTER — Telehealth: Payer: Self-pay

## 2018-09-08 NOTE — Telephone Encounter (Signed)
I called pt to offer her a sooner appt with Dr. Rexene Alberts since she is on the waitlist. We have an opening on 09/11/18 at 3:00pm. No answer, left a message asking her to call me back. If pt calls back, please offer her this appt if it is still available.

## 2018-09-12 ENCOUNTER — Ambulatory Visit (INDEPENDENT_AMBULATORY_CARE_PROVIDER_SITE_OTHER): Payer: Managed Care, Other (non HMO) | Admitting: Neurology

## 2018-09-12 ENCOUNTER — Encounter: Payer: Self-pay | Admitting: Neurology

## 2018-09-12 VITALS — BP 168/92 | HR 69 | Ht 67.0 in | Wt 299.0 lb

## 2018-09-12 DIAGNOSIS — R351 Nocturia: Secondary | ICD-10-CM

## 2018-09-12 DIAGNOSIS — R0683 Snoring: Secondary | ICD-10-CM

## 2018-09-12 DIAGNOSIS — R51 Headache: Secondary | ICD-10-CM | POA: Diagnosis not present

## 2018-09-12 DIAGNOSIS — Z6841 Body Mass Index (BMI) 40.0 and over, adult: Secondary | ICD-10-CM

## 2018-09-12 DIAGNOSIS — R519 Headache, unspecified: Secondary | ICD-10-CM

## 2018-09-12 NOTE — Progress Notes (Signed)
Subjective:    Patient ID: Sarah House is a 62 y.o. female.  HPI     Star Age, MD, PhD [facility] [address], [address] P.O. [place],  [address]  Dear Bonnita Levan,   I saw your patient, Sarah House, upon your kind request in my clinic today for initial consultation of her sleep disorder, in particular, concern for underlying obstructive sleep apnea. The patient is accompanied by her husband today. As you know, Sarah House is a 62 year old right-handed woman with an underlying medical history of hypertension, hyperthyroidism, hyperlipidemia, reflux disease, diabetes, arthritis, anemia, recurrent headaches and morbid obesity with BMI of over 45, who reports snoring and excessive daytime somnolence. I reviewed your office note from 08/16/2018. Her Epworth sleepiness score is 6 out of 24 today, fatigue score is 40 out of 63. She is a nonsmoker and does not utilize all call, she drinks caffeine in the form of coffee, one cup per day on average. She is planning to retire by the end of this month. She tries to hydrate well with water. BT is around 10:30 to 11 PM, falls asleep okay, has nocturia about 2/night, no FHx of OSA, husband has OSA and uses a CPAP. Works in Therapist, art. She has had AM HAs.   Her Past Medical History Is Significant For: Past Medical History:  Diagnosis Date  . Anemia   . Arthritis    knees  . Diabetes mellitus without complication ([place])   . GERD (gastroesophageal reflux disease)   . Hypercholesterolemia   . Hypertension   . Hyperthyroidism    had "radiation to kill it"    Her Past Surgical History Is Significant For: Past Surgical History:  Procedure Laterality Date  . BREAST LUMPECTOMY WITH RADIOACTIVE SEED LOCALIZATION Right 12/23/2015   Procedure: RIGHT BREAST LUMPECTOMY WITH RADIOACTIVE SEED LOCALIZATION;  Surgeon: Coralie Keens, MD;  Location: [place];  Service: General;  Laterality: Right;  .  COLONOSCOPY W/ POLYPECTOMY    . COLONOSCOPY WITH PROPOFOL N/A 04/09/2016   Procedure: COLONOSCOPY WITH PROPOFOL;  Surgeon: Carol Ada, MD;  Location: [facility];  Service: Endoscopy;  Laterality: N/A;  . ESOPHAGOGASTRODUODENOSCOPY (EGD) WITH PROPOFOL N/A 04/09/2016   Procedure: ESOPHAGOGASTRODUODENOSCOPY (EGD) WITH PROPOFOL;  Surgeon: Carol Ada, MD;  Location: [facility];  Service: Endoscopy;  Laterality: N/A;  . GIVENS CAPSULE STUDY N/A 06/21/2016   Procedure: GIVENS CAPSULE STUDY;  Surgeon: Juanita Craver, MD;  Location: [place];  Service: Endoscopy;  Laterality: N/A;  . KNEE ARTHROSCOPY      Her Family History Is Significant For: Family History  Problem Relation Age of Onset  . Diabetes Mother   . Lymphoma Father   . Leukemia Maternal Uncle   . Cancer Paternal Grandfather        stomach  . Heart disease Sister     Her Social History Is Significant For: Social History   Socioeconomic History  . Marital status: Married    Spouse name: Herbie [place]  . Number of children: 1  . Years of education: 42  . Highest education level: Not on file  Occupational History    Comment: customer service rep [employer]  Social Needs  . Financial resource strain: Not on file  . Food insecurity:    Worry: Not on file    Inability: Not on file  . Transportation needs:    Medical: Not on file    Non-medical: Not on file  Tobacco Use  . Smoking status: Never Smoker  .  Smokeless tobacco: Never Used  Substance and Sexual Activity  . Alcohol use: No    Alcohol/week: 0.0 standard drinks  . Drug use: No  . Sexual activity: Not on file  Lifestyle  . Physical activity:    Days per week: Not on file    Minutes per session: Not on file  . Stress: Not on file  Relationships  . Social connections:    Talks on phone: Not on file    Gets together: Not on file    Attends religious service: Not on file    Active member of club or organization: Not on file    Attends meetings of clubs or  organizations: Not on file    Relationship status: Not on file  Other Topics Concern  . Not on file  Social History Narrative   Lives with husband   Caffeine- coffee 1 cup daily    Her Allergies Are:  No Known Allergies:   Her Current Medications Are:  Outpatient Encounter Medications as of 09/12/2018  Medication Sig  . amLODipine (NORVASC) 5 MG tablet Take 5 mg by mouth daily.  Marland Kitchen aspirin EC 81 MG tablet Take 81 mg by mouth daily.  Marland Kitchen atorvastatin (LIPITOR) 20 MG tablet Take 20 mg by mouth daily.  . Calcium Carbonate-Vitamin D (CALCIUM 500 + D PO) Take 1 tablet by mouth daily.  . cyclobenzaprine (FLEXERIL) 10 MG tablet Take 10 mg by mouth 3 (three) times daily as needed for muscle spasms.   Marland Kitchen glimepiride (AMARYL) 4 MG tablet Take 8 mg by mouth daily with breakfast.  . Insulin Degludec (TRESIBA FLEXTOUCH) 200 UNIT/ML SOPN Inject 36 Units into the skin at bedtime.   . insulin lispro (HUMALOG KWIKPEN) 100 UNIT/ML KiwkPen Inject 6 Units into the skin.   Marland Kitchen losartan-hydrochlorothiazide (HYZAAR) 100-25 MG per tablet Take 1 tablet by mouth daily.  . meloxicam (MOBIC) 15 MG tablet Take 15 mg by mouth daily as needed for pain.   . metFORMIN (GLUCOPHAGE) 500 MG tablet Take 1,000 mg by mouth 2 (two) times daily with a meal.   . METROGEL 1 % gel Apply 1 application topically daily as needed (Apply to affected area.).   Marland Kitchen Multiple Vitamin (MULTIVITAMIN WITH MINERALS) TABS tablet Take 1 tablet by mouth daily.  Marland Kitchen omeprazole (PRILOSEC) 10 MG capsule Take 10 mg by mouth daily.  Marland Kitchen tiZANidine (ZANAFLEX) 4 MG tablet Take 4 mg by mouth as needed.   No facility-administered encounter medications on file as of 09/12/2018.   :  Review of Systems:  Out of a complete 14 point review of systems, all are reviewed and negative with the exception of these symptoms as listed below: Review of Systems  Neurological:       Pt presents today to discuss her sleep. Pt has never had a sleep study but does endorse  snoring.  Epworth Sleepiness Scale 0= would never doze 1= slight chance of dozing 2= moderate chance of dozing 3= high chance of dozing  Sitting and reading: 2 Watching TV: 2 Sitting inactive in a public place (ex. Theater or meeting): 1 As a passenger in a car for an hour without a break: 1 Lying down to rest in the afternoon: 0 Sitting and talking to someone: 0 Sitting quietly after lunch (no alcohol): 0 In a car, while stopped in traffic: 0 Total: 6     Objective:  Neurological Exam  Physical Exam Physical Examination:   Vitals:   09/12/18 1107  BP: (!) 186/85  Pulse: 69   General Examination: The patient is a very pleasant 62 y.o. female in no acute distress. She appears well-developed and well-nourished and well groomed.   HEENT: Normocephalic, atraumatic, pupils are equal, round and reactive to light and accommodation. Extraocular tracking is good without limitation to gaze excursion or nystagmus noted. Normal smooth pursuit is noted. Hearing is grossly intact. Face is symmetric with normal facial animation and normal facial sensation. Speech is clear with no dysarthria noted. There is no hypophonia. There is no lip, neck/head, jaw or voice tremor. Neck is supple with full range of passive and active motion. There are no carotid bruits on auscultation. Oropharynx exam reveals: moderate mouth dryness, adequate dental hygiene and moderate airway crowding, due to smaller airway entry and redundant soft palate. Mallampati is class II. Tongue protrudes centrally and palate elevates symmetrically. Tonsils are smaller. Neck size is 16 inches. She has a Moderate overbite.   Chest: Clear to auscultation without wheezing, rhonchi or crackles noted.  Heart: S1+S2+0, regular and normal without murmurs, rubs or gallops noted.   Abdomen: Soft, non-tender and non-distended with normal bowel sounds appreciated on auscultation.  Extremities: There is non-pitting puffiness L distal leg  more than R.   Skin: Warm and dry without trophic changes noted.   Musculoskeletal: exam reveals no obvious joint deformities, tenderness or joint swelling or erythema, with the exception of left knee pain reported.   Neurologically:  Mental status: The patient is awake, alert and oriented in all 4 spheres. Her immediate and remote memory, attention, language skills and fund of knowledge are appropriate. There is no evidence of aphasia, agnosia, apraxia or anomia. Speech is clear with normal prosody and enunciation. Thought process is linear. Mood is normal and affect is normal.  Cranial nerves II - XII are as described above under HEENT exam. In addition: shoulder shrug is normal with equal shoulder height noted. Motor exam: Normal bulk, strength and tone is noted. There is no drift, tremor or rebound. Romberg is not tested d/t safety concerns. Fine motor skills and coordination: intact with normal finger taps, normal hand movements, normal rapid alternating patting, normal foot taps and normal foot agility.  Cerebellar testing: No dysmetria or intention tremor on finger to nose testing. Heel to shin is unremarkable bilaterally. There is no truncal or gait ataxia.  Sensory exam: intact to light touch in the upper and lower extremities.  Gait, station and balance: She stands with mild difficulty. Posture is age-appropriate. She walks slowly and cautiously with a slight limp on the left.         Assessment and Plan:    In summary, Sarah House is a very pleasant 62 y.o.-year old female with an underlying medical history of hypertension, hyperthyroidism, hyperlipidemia, reflux disease, diabetes, arthritis, anemia, recurrent headaches and morbid obesity with BMI of over 45, whose history and physical exam are concerning for obstructive sleep apnea (OSA). I had a long chat with the patient and her husband about my findings and the diagnosis of OSA, its prognosis and treatment options. We talked  about medical treatments, surgical interventions and non-pharmacological approaches. I explained in particular the risks and ramifications of untreated moderate to severe OSA, especially with respect to developing cardiovascular disease down the Road, including congestive heart failure, difficult to treat hypertension, cardiac arrhythmias, or stroke. Even type 2 diabetes has, in part, been linked to untreated OSA. Symptoms of untreated OSA include daytime sleepiness, memory problems, mood irritability and mood disorder such as depression  and anxiety, lack of energy, as well as recurrent headaches, especially morning headaches. We talked about trying to maintain a healthy lifestyle in general, as well as the importance of weight control. I encouraged the patient to eat healthy, exercise daily and keep well hydrated, to keep a scheduled bedtime and wake time routine, to not skip any meals and eat healthy snacks in between meals. I advised the patient not to drive when feeling sleepy. I recommended the following at this time: sleep study with potential positive airway pressure titration. (We will score hypopneas at 4%).   I explained the sleep test procedure to the patient and also outlined possible surgical and non-surgical treatment options of OSA, including the use of a custom-made dental device (which would require a referral to a specialist dentist or oral surgeon), upper airway surgical options, such as pillar implants, radiofrequency surgery, tongue base surgery, and UPPP (which would involve a referral to an ENT surgeon). Rarely, jaw surgery such as mandibular advancement may be considered.  I also explained the CPAP treatment option to the patient, who indicated that she would be willing to try CPAP if the need arises. I explained the importance of being compliant with PAP treatment, not only for insurance purposes but primarily to improve Her symptoms, and for the patient's long term health benefit,  including to reduce Her cardiovascular risks. I answered all their questions today and the patient and her husband were in agreement. I plan to see her back after the sleep study is completed and encouraged her to call with any interim questions, concerns, problems or updates.   Thank you very much for allowing me to participate in the care of this nice patient. If I can be of any further assistance to you please do not hesitate to call me at [phone].  Sincerely,   Star Age, MD, PhD

## 2018-09-12 NOTE — Patient Instructions (Signed)

## 2018-10-05 ENCOUNTER — Institutional Professional Consult (permissible substitution): Payer: Managed Care, Other (non HMO) | Admitting: Neurology

## 2018-10-05 ENCOUNTER — Encounter

## 2018-10-11 ENCOUNTER — Ambulatory Visit (INDEPENDENT_AMBULATORY_CARE_PROVIDER_SITE_OTHER): Payer: Managed Care, Other (non HMO) | Admitting: Neurology

## 2018-10-11 DIAGNOSIS — G4733 Obstructive sleep apnea (adult) (pediatric): Secondary | ICD-10-CM | POA: Diagnosis not present

## 2018-10-11 DIAGNOSIS — Z6841 Body Mass Index (BMI) 40.0 and over, adult: Secondary | ICD-10-CM

## 2018-10-11 DIAGNOSIS — R519 Headache, unspecified: Secondary | ICD-10-CM

## 2018-10-11 DIAGNOSIS — R351 Nocturia: Secondary | ICD-10-CM

## 2018-10-11 DIAGNOSIS — R0683 Snoring: Secondary | ICD-10-CM

## 2018-10-11 DIAGNOSIS — R51 Headache: Secondary | ICD-10-CM

## 2018-10-12 ENCOUNTER — Ambulatory Visit: Payer: Managed Care, Other (non HMO) | Admitting: Neurology

## 2018-10-19 ENCOUNTER — Telehealth: Payer: Self-pay

## 2018-10-19 NOTE — Telephone Encounter (Signed)
I called pt. I advised pt that Dr. Rexene Alberts reviewed their sleep study results and found that pt has severe osa. Dr. Rexene Alberts recommends that pt start an auto pap and complete an ONO on auto pap after 1 week of auto pap usage. I reviewed PAP compliance expectations with the pt. Pt is agreeable to starting an auto-PAP. I advised pt that an order will be sent to a DME, [facility], and [facility] will call the pt within about one week after they file with the pt's insurance. [facility] will show the pt how to use the machine, fit for masks, and troubleshoot the auto-PAP if needed. A follow up appt was made for insurance purposes with Hoyle Sauer, NP on 01/04/19 at 9:15am. Pt verbalized understanding to arrive 15 minutes early and bring their auto-PAP. A letter with all of this information in it will be mailed to the pt as a reminder. I verified with the pt that the address we have on file is correct. Pt verbalized understanding of results. Pt had no questions at this time but was encouraged to call back if questions arise. I have sent the order to [facility] and have received confirmation that they have received the order.

## 2018-10-19 NOTE — Telephone Encounter (Signed)
-----   Message from Star Age, MD sent at 10/19/2018  2:08 PM EST ----- Patient referred by Dr. Leta Baptist, seen by me on 09/12/18, HST on 10/11/18.    Please call and notify the patient that the recent home sleep test showed obstructive sleep apnea in the severe range. While I recommend treatment for this in the form CPAP, her insurance will not approve a sleep study for this. They will likely only approve a trial of autoPAP, which means, that we don't have to bring her in for a sleep study with CPAP, but will let her try an autoPAP machine at home, through a DME company (of her choice, or as per insurance requirement). The DME representative will educate her on how to use the machine, how to put the mask on, etc. I have placed an order in the chart. Please send referral, talk to patient, send report to referring MD. We will need a FU in sleep clinic for 10 weeks post-PAP set up, please arrange that with me or one of our NPs.  I will also request an ONO, about 1 week post autoPAP set up. Please advise pt and send order for this to DME of choice.    Star Age, MD, PhD [facility])

## 2018-10-19 NOTE — Procedures (Signed)
[facility] @[facility] [place], [place] [address] NAME:    [place]                                                        DOB: 21-Jan-1956 MEDICAL RECORD no:    [id number]                                   DOS: 10/11/2018  REFERRING PHYSICIAN: Andrey Spearman, MD STUDY PERFORMED: Home Sleep Test on [facility] HISTORY: 62 year old woman with a history of hypertension, hyperthyroidism, hyperlipidemia, reflux disease, diabetes, arthritis, anemia, recurrent headaches and morbid obesity, who reports snoring and excessive daytime somnolence. Her Epworth sleepiness score is 6 out of 24, BMI of 47.1.  STUDY RESULTS:   Total Recording Time: 9 hrs, 44 mins; Total Sleep Time: 8 hrs, 53 mins Total Apnea/Hypopnea Index (AHI): 42.0/h; RDI: 42.4/h; REM AHI: 56.3/h Average Oxygen Saturation: 95%; Lowest Oxygen Desaturation:  76 %  Total Time Oxygen Saturation Below or at 88 %: 6.9 minutes  Average Heart Rate:  70 bpm (between 56 and 102 bpm) IMPRESSION: OSA RECOMMENDATION: This home sleep test demonstrates severe obstructive sleep apnea with a total AHI of 42/hour and O2 nadir of 76%. Given the patient's medical history and sleep related complaints, treatment with positive airway pressure (in the form of CPAP) is recommended. This will require a full night CPAP titration study for proper treatment settings, O2 monitoring and mask fitting. Based on the severity of the sleep disordered breathing an attended titration study is indicated. However, patient's insurance has denied an attended sleep study; therefore, the patient will be advised to proceed with an autoPAP titration/trial at home for now. I will also request an overnight pulse oximetry test to review O2 saturations, once the patient is established on autoPAP at home. Please note, that untreated obstructive sleep apnea may carry additional perioperative morbidity. Patients with significant obstructive sleep apnea  should receive perioperative PAP therapy and the surgeons and particularly the anesthesiologist should be informed of the diagnosis and the severity of the sleep disordered breathing. The patient should be cautioned not to drive, work at heights, or operate dangerous or heavy equipment when tired or sleepy. Review and reiteration of good sleep hygiene measures should be pursued with any patient. Other causes of the patient's symptoms, including circadian rhythm disturbances, an underlying mood disorder, medication effect and/or an underlying medical problem cannot be ruled out based on this test. Clinical correlation is recommended. The patient and her referring provider will be notified of the test results. The patient will be seen in follow up in sleep clinic at [facility]. I certify that I have reviewed the raw data recording prior to the issuance of this report in accordance with the standards of the American Academy of Sleep Medicine (AASM).  Star Age, MD, PhD [facility]) Diplomat, ABPN (Neurology and Sleep)

## 2018-10-19 NOTE — Progress Notes (Signed)
Patient referred by Dr. Leta Baptist, seen by me on 09/12/18, HST on 10/11/18.    Please call and notify the patient that the recent home sleep test showed obstructive sleep apnea in the severe range. While I recommend treatment for this in the form CPAP, her insurance will not approve a sleep study for this. They will likely only approve a trial of autoPAP, which means, that we don't have to bring her in for a sleep study with CPAP, but will let her try an autoPAP machine at home, through a DME company (of her choice, or as per insurance requirement). The DME representative will educate her on how to use the machine, how to put the mask on, etc. I have placed an order in the chart. Please send referral, talk to patient, send report to referring MD. We will need a FU in sleep clinic for 10 weeks post-PAP set up, please arrange that with me or one of our NPs.  I will also request an ONO, about 1 week post autoPAP set up. Please advise pt and send order for this to DME of choice.    Star Age, MD, PhD [facility])

## 2018-10-19 NOTE — Telephone Encounter (Signed)
I called pt to discuss her sleep study results. No answer, left a message asking her to call me back. 

## 2018-10-19 NOTE — Addendum Note (Signed)
Addended by: Star Age on: 10/19/2018 02:08 PM   Modules accepted: Orders

## 2018-11-14 ENCOUNTER — Ambulatory Visit (INDEPENDENT_AMBULATORY_CARE_PROVIDER_SITE_OTHER): Payer: Managed Care, Other (non HMO) | Admitting: Podiatry

## 2018-11-14 DIAGNOSIS — E1142 Type 2 diabetes mellitus with diabetic polyneuropathy: Secondary | ICD-10-CM

## 2018-11-14 DIAGNOSIS — M79674 Pain in right toe(s): Secondary | ICD-10-CM

## 2018-11-14 DIAGNOSIS — B351 Tinea unguium: Secondary | ICD-10-CM | POA: Diagnosis not present

## 2018-11-14 DIAGNOSIS — R600 Localized edema: Secondary | ICD-10-CM | POA: Diagnosis not present

## 2018-11-14 DIAGNOSIS — M79675 Pain in left toe(s): Secondary | ICD-10-CM | POA: Diagnosis not present

## 2018-11-14 NOTE — Patient Instructions (Addendum)
Onychomycosis/Fungal Toenails  WHAT IS IT? An infection that lies within the keratin of your nail plate that is caused by a fungus.  WHY ME? Fungal infections affect all ages, sexes, races, and creeds.  There may be many factors that predispose you to a fungal infection such as age, coexisting medical conditions such as diabetes, or an autoimmune disease; stress, medications, fatigue, genetics, etc.  Bottom line: fungus thrives in a warm, moist environment and your shoes offer such a location.  IS IT CONTAGIOUS? Theoretically, yes.  You do not want to share shoes, nail clippers or files with someone who has fungal toenails.  Walking around barefoot in the same room or sleeping in the same bed is unlikely to transfer the organism.  It is important to realize, however, that fungus can spread easily from one nail to the next on the same foot.  HOW DO WE TREAT THIS?  There are several ways to treat this condition.  Treatment may depend on many factors such as age, medications, pregnancy, liver and kidney conditions, etc.  It is best to ask your doctor which options are available to you.  1. No treatment.   Unlike many other medical concerns, you can live with this condition.  However for many people this can be a painful condition and may lead to ingrown toenails or a bacterial infection.  It is recommended that you keep the nails cut short to help reduce the amount of fungal nail. 2. Topical treatment.  These range from herbal remedies to prescription strength nail lacquers.  About 40-50% effective, topicals require twice daily application for approximately 9 to 12 months or until an entirely new nail has grown out.  The most effective topicals are medical grade medications available through physicians offices. 3. Oral antifungal medications.  With an 80-90% cure rate, the most common oral medication requires 3 to 4 months of therapy and stays in your system for a year as the new nail grows out.  Oral  antifungal medications do require blood work to make sure it is a safe drug for you.  A liver function panel will be performed prior to starting the medication and after the first month of treatment.  It is important to have the blood work performed to avoid any harmful side effects.  In general, this medication safe but blood work is required. 4. Laser Therapy.  This treatment is performed by applying a specialized laser to the affected nail plate.  This therapy is noninvasive, fast, and non-painful.  It is not covered by insurance and is therefore, out of pocket.  The results have been very good with a 80-95% cure rate.  [facility] is the only practice in the area to offer this therapy. 5. Permanent Nail Avulsion.  Removing the entire nail so that a new nail will not grow back.  Diabetes Mellitus and Foot Care Foot care is an important part of your health, especially when you have diabetes. Diabetes may cause you to have problems because of poor blood flow (circulation) to your feet and legs, which can cause your skin to:  Become thinner and drier.  Break more easily.  Heal more slowly.  Peel and crack. You may also have nerve damage (neuropathy) in your legs and feet, causing decreased feeling in them. This means that you may not notice minor injuries to your feet that could lead to more serious problems. Noticing and addressing any potential problems early is the best way to prevent   future foot problems. How to care for your feet Foot hygiene  Wash your feet daily with warm water and mild soap. Do not use hot water. Then, pat your feet and the areas between your toes until they are completely dry. Do not soak your feet as this can dry your skin.  Trim your toenails straight across. Do not dig under them or around the cuticle. File the edges of your nails with an emery board or nail file.  Apply a moisturizing lotion or petroleum jelly to the skin on your feet and to dry, brittle  toenails. Use lotion that does not contain alcohol and is unscented. Do not apply lotion between your toes. Shoes and socks  Wear clean socks or stockings every day. Make sure they are not too tight. Do not wear knee-high stockings since they may decrease blood flow to your legs.  Wear shoes that fit properly and have enough cushioning. Always look in your shoes before you put them on to be sure there are no objects inside.  To break in new shoes, wear them for just a few hours a day. This prevents injuries on your feet. Wounds, scrapes, corns, and calluses  Check your feet daily for blisters, cuts, bruises, sores, and redness. If you cannot see the bottom of your feet, use a mirror or ask someone for help.  Do not cut corns or calluses or try to remove them with medicine.  If you find a minor scrape, cut, or break in the skin on your feet, keep it and the skin around it clean and dry. You may clean these areas with mild soap and water. Do not clean the area with peroxide, alcohol, or iodine.  If you have a wound, scrape, corn, or callus on your foot, look at it several times a day to make sure it is healing and not infected. Check for: ? Redness, swelling, or pain. ? Fluid or blood. ? Warmth. ? Pus or a bad smell. General instructions  Do not cross your legs. This may decrease blood flow to your feet.  Do not use heating pads or hot water bottles on your feet. They may burn your skin. If you have lost feeling in your feet or legs, you may not know this is happening until it is too late.  Protect your feet from hot and cold by wearing shoes, such as at the beach or on hot pavement.  Schedule a complete foot exam at least once a year (annually) or more often if you have foot problems. If you have foot problems, report any cuts, sores, or bruises to your health care provider immediately. Contact a health care provider if:  You have a medical condition that increases your risk of  infection and you have any cuts, sores, or bruises on your feet.  You have an injury that is not healing.  You have redness on your legs or feet.  You feel burning or tingling in your legs or feet.  You have pain or cramps in your legs and feet.  Your legs or feet are numb.  Your feet always feel cold.  You have pain around a toenail. Get help right away if:  You have a wound, scrape, corn, or callus on your foot and: ? You have pain, swelling, or redness that gets worse. ? You have fluid or blood coming from the wound, scrape, corn, or callus. ? Your wound, scrape, corn, or callus feels warm to the touch. ? You   have pus or a bad smell coming from the wound, scrape, corn, or callus. ? You have a fever. ? You have a red line going up your leg. Summary  Check your feet every day for cuts, sores, red spots, swelling, and blisters.  Moisturize feet and legs daily.  Wear shoes that fit properly and have enough cushioning.  If you have foot problems, report any cuts, sores, or bruises to your health care provider immediately.  Schedule a complete foot exam at least once a year (annually) or more often if you have foot problems. This information is not intended to replace advice given to you by your health care provider. Make sure you discuss any questions you have with your health care provider. Document Released: 10/08/2000 Document Revised: 11/23/2017 Document Reviewed: 11/12/2016 Elsevier Interactive Patient Education  2019 [employer]  Edema  Edema is when you have too much fluid in your body or under your skin. Edema may make your legs, feet, and ankles swell up. Swelling is also common in looser tissues, like around your eyes. This is a common condition. It gets more common as you get older. There are many possible causes of edema. Eating too much salt (sodium) and being on your feet or sitting for a long time can cause edema in your legs, feet, and ankles. Hot weather may  make edema worse. Edema is usually painless. Your skin may look swollen or shiny. Follow these instructions at home: Keep the swollen body part raised (elevated) above the level of your heart when you are sitting or lying down. Do not sit still or stand for a long time. Do not wear tight clothes. Do not wear garters on your upper legs. Exercise your legs. This can help the swelling go down. Wear elastic bandages or support stockings as told by your doctor. Eat a low-salt (low-sodium) diet to reduce fluid as told by your doctor. Depending on the cause of your swelling, you may need to limit how much fluid you drink (fluid restriction). Take over-the-counter and prescription medicines only as told by your doctor. Contact a doctor if: Treatment is not working. You have heart, liver, or kidney disease and have symptoms of edema. You have sudden and unexplained weight gain. Get help right away if: You have shortness of breath or chest pain. You cannot breathe when you lie down. You have pain, redness, or warmth in the swollen areas. You have heart, liver, or kidney disease and get edema all of a sudden. You have a fever and your symptoms get worse all of a sudden. Summary Edema is when you have too much fluid in your body or under your skin. Edema may make your legs, feet, and ankles swell up. Swelling is also common in looser tissues, like around your eyes. Raise (elevate) the swollen body part above the level of your heart when you are sitting or lying down. Follow your doctor's instructions about diet and how much fluid you can drink (fluid restriction). This information is not intended to replace advice given to you by your health care provider. Make sure you discuss any questions you have with your health care provider. Document Released: 03/29/2008 Document Revised: 10/29/2016 Document Reviewed: 10/29/2016 Elsevier Interactive Patient Education  2019 [employer].

## 2018-11-27 ENCOUNTER — Encounter: Payer: Self-pay | Admitting: Podiatry

## 2018-11-27 NOTE — Progress Notes (Signed)
Subjective: Sarah House presents today with history of neuropathy with cc of painful, mycotic toenails.  Pain is aggravated when wearing enclosed shoe gear and relieved with periodic professional debridement.  Patient would like to discuss options available to her for her chronic lower extremity edema.  Sarah Seashore, MD is her PCP.   Current Outpatient Medications:  .  amLODipine (NORVASC) 5 MG tablet, Take 5 mg by mouth daily., Disp: , Rfl:  .  aspirin EC 81 MG tablet, Take 81 mg by mouth daily., Disp: , Rfl:  .  atorvastatin (LIPITOR) 20 MG tablet, Take 20 mg by mouth daily., Disp: , Rfl:  .  B-D ULTRAFINE III SHORT PEN 31G X 8 MM MISC, , Disp: , Rfl:  .  Calcium Carbonate-Vitamin D (CALCIUM 500 + D PO), Take 1 tablet by mouth daily., Disp: , Rfl:  .  cyclobenzaprine (FLEXERIL) 10 MG tablet, Take 10 mg by mouth 3 (three) times daily as needed for muscle spasms. , Disp: , Rfl:  .  glimepiride (AMARYL) 4 MG tablet, Take 8 mg by mouth daily with breakfast., Disp: , Rfl:  .  hydrochlorothiazide (HYDRODIURIL) 25 MG tablet, , Disp: , Rfl:  .  Insulin Degludec (TRESIBA FLEXTOUCH) 200 UNIT/ML SOPN, Inject 36 Units into the skin at bedtime. , Disp: , Rfl:  .  insulin lispro (HUMALOG KWIKPEN) 100 UNIT/ML KiwkPen, Inject 6 Units into the skin. , Disp: , Rfl:  .  losartan-hydrochlorothiazide (HYZAAR) 100-25 MG per tablet, Take 1 tablet by mouth daily., Disp: , Rfl:  .  meloxicam (MOBIC) 15 MG tablet, Take 15 mg by mouth daily as needed for pain. , Disp: , Rfl:  .  metFORMIN (GLUCOPHAGE) 500 MG tablet, Take 1,000 mg by mouth 2 (two) times daily with a meal. , Disp: , Rfl:  .  metFORMIN (GLUCOPHAGE-XR) 500 MG 24 hr tablet, , Disp: , Rfl:  .  METROGEL 1 % gel, Apply 1 application topically daily as needed (Apply to affected area.). , Disp: , Rfl:  .  Multiple Vitamin (MULTIVITAMIN WITH MINERALS) TABS tablet, Take 1 tablet by mouth daily., Disp: , Rfl:  .  olmesartan (BENICAR) 40 MG tablet, ,  Disp: , Rfl:  .  omeprazole (PRILOSEC) 10 MG capsule, Take 10 mg by mouth daily., Disp: , Rfl:  .  tiZANidine (ZANAFLEX) 4 MG tablet, Take 4 mg by mouth as needed., Disp: , Rfl: 0  No Known Allergies  Objective:  Vascular Examination: Capillary refill time less than 3 seconds x 10 digits Dorsalis pedis 1/4 bilaterally  Posterior tibial pulses 1/4 bilaterally Digital hair x 10 digits was absent Skin temperature gradient WNL b/l Bilateral pedal and ankle edema noted  Dermatological Examination: Skin with normal turgor, texture and tone b/l  Toenails 1-5 b/l discolored, thick, dystrophic with subungual debris and pain with palpation to nailbeds due to thickness of nails.  No open wounds noted bilaterally  No interdigital macerations noted bilaterally  Musculoskeletal: Muscle strength 5/5 to all muscle groups b/l  Neurological: Sensation with 10 gram monofilament is intact b/l Vibratory sensation intact b/l  Assessment: 1. Painful onychomycosis toenails 1-5 b/l 2. NIDDM with neuropathy 3. Lower extremity edema 4. Venous insufficiency bilaterally  Plan: 1. Toenails 1-5 b/l were debrided in length and girth without iatrogenic bleeding. 2. Discussed compression therapy for chronic lower extremity edema.  Recommended waist high or knee-high compression hose.  Prescription written for [facility] medical supply to supply compression hose 20 to 30 mmHg.  Specification stated that  patient should be measured for her stockings.  We did discuss signs and symptoms of constriction.  If patient experiences any discoloration, change in temperature, numbness or pain with her stockings on, she should remove them immediately. 3. Patient to continue soft, supportive shoe gear 4. Patient to report any pedal injuries to medical professional  5. Follow up 3 months. Patient/POA to call should there be a concern in the interim.

## 2019-01-02 ENCOUNTER — Encounter: Payer: Self-pay | Admitting: Nurse Practitioner

## 2019-01-03 NOTE — Progress Notes (Addendum)
[facility]  PATIENT: Sarah House DOB: Apr 01, 1956   REASON FOR VISIT: Follow-up for newly diagnosed severe obstructive sleep apnea with initial CPAP HISTORY FROM: Patient    HISTORY OF PRESENT ILLNESS:UPDATE 3/12/2020CM [name], 63 year old female returns for follow-up with history of newly diagnosed obstructive sleep apnea here for initial CPAP.  She says she feels so much better she is well rested at night. Compliance data dated 12/04/2018 to 01/02/2019 shows compliance greater than 4 hours at 100%.  Average usage 8 hours 51 minutes.  Set pressure 7 to 13 cm.  Sarah House level 3 leak 95th percentile 8.7.  AHI 3.0 ESS 0 FSS 14.  She returns for reevaluation of  11/19/19SAMs. Sarah House is a 63 year old right-handed woman with an underlying medical history of hypertension, hyperthyroidism, hyperlipidemia, reflux disease, diabetes, arthritis, anemia, recurrent headaches and morbid obesity with BMI of over 45, who reports snoring and excessive daytime somnolence. I reviewed your office note from 08/16/2018. Her Epworth sleepiness score is 6 out of 24 today, fatigue score is 40 out of 63. She is a nonsmoker and does not utilize all call, she drinks caffeine in the form of coffee, one cup per day on average. She is planning to retire by the end of this month. She tries to hydrate well with water. BT is around 10:30 to 11 PM, falls asleep okay, has nocturia about 2/night, no FHx of OSA, husband has OSA and uses a CPAP. Works in Therapist, art. She has had AM HAs.   REVIEW OF SYSTEMS: Full 14 system review of systems performed and notable only for those listed, all others are neg:  Constitutional: neg  Cardiovascular: neg Ear/Nose/Throat: neg  Skin: neg Eyes: neg Respiratory: neg Gastroitestinal: neg  Hematology/Lymphatic: neg  Endocrine: neg Musculoskeletal:neg Allergy/Immunology: neg Neurological: neg Psychiatric: neg Sleep : Newly diagnosed obstructive sleep apnea with  initial CPAP   ALLERGIES: No Known Allergies  HOME MEDICATIONS: Outpatient Medications Prior to Visit  Medication Sig Dispense Refill  . amLODipine (NORVASC) 5 MG tablet Take 5 mg by mouth daily.    Marland Kitchen aspirin EC 81 MG tablet Take 81 mg by mouth daily.    Marland Kitchen atorvastatin (LIPITOR) 20 MG tablet Take 20 mg by mouth daily.    . B-D ULTRAFINE III SHORT PEN 31G X 8 MM MISC     . Calcium Carbonate-Vitamin D (CALCIUM 500 + D PO) Take 1 tablet by mouth daily.    . cyclobenzaprine (FLEXERIL) 10 MG tablet Take 10 mg by mouth 3 (three) times daily as needed for muscle spasms.     Marland Kitchen glimepiride (AMARYL) 4 MG tablet Take 8 mg by mouth daily with breakfast.    . hydrochlorothiazide (HYDRODIURIL) 25 MG tablet     . Insulin Degludec (TRESIBA FLEXTOUCH) 200 UNIT/ML SOPN Inject 36 Units into the skin at bedtime.     . insulin lispro (HUMALOG KWIKPEN) 100 UNIT/ML KiwkPen Inject 6 Units into the skin.     Marland Kitchen losartan-hydrochlorothiazide (HYZAAR) 100-25 MG per tablet Take 1 tablet by mouth daily.    . meloxicam (MOBIC) 15 MG tablet Take 15 mg by mouth daily as needed for pain.     . metFORMIN (GLUCOPHAGE) 500 MG tablet Take 1,000 mg by mouth 2 (two) times daily with a meal.     . metFORMIN (GLUCOPHAGE-XR) 500 MG 24 hr tablet     . METROGEL 1 % gel Apply 1 application topically daily as needed (Apply to affected area.).     Marland Kitchen  Multiple Vitamin (MULTIVITAMIN WITH MINERALS) TABS tablet Take 1 tablet by mouth daily.    Marland Kitchen olmesartan (BENICAR) 40 MG tablet     . omeprazole (PRILOSEC) 10 MG capsule Take 10 mg by mouth daily.    Marland Kitchen tiZANidine (ZANAFLEX) 4 MG tablet Take 4 mg by mouth as needed.  0   No facility-administered medications prior to visit.     PAST MEDICAL HISTORY: Past Medical History:  Diagnosis Date  . Anemia   . Arthritis    knees  . Diabetes mellitus without complication ([place])   . GERD (gastroesophageal reflux disease)   . Hypercholesterolemia   . Hypertension   . Hyperthyroidism    had  "radiation to kill it"    PAST SURGICAL HISTORY: Past Surgical History:  Procedure Laterality Date  . BREAST LUMPECTOMY WITH RADIOACTIVE SEED LOCALIZATION Right 12/23/2015   Procedure: RIGHT BREAST LUMPECTOMY WITH RADIOACTIVE SEED LOCALIZATION;  Surgeon: Coralie Keens, MD;  Location: [facility];  Service: General;  Laterality: Right;  . COLONOSCOPY W/ POLYPECTOMY    . COLONOSCOPY WITH PROPOFOL N/A 04/09/2016   Procedure: COLONOSCOPY WITH PROPOFOL;  Surgeon: Carol Ada, MD;  Location: [facility];  Service: Endoscopy;  Laterality: N/A;  . ESOPHAGOGASTRODUODENOSCOPY (EGD) WITH PROPOFOL N/A 04/09/2016   Procedure: ESOPHAGOGASTRODUODENOSCOPY (EGD) WITH PROPOFOL;  Surgeon: Carol Ada, MD;  Location: [facility];  Service: Endoscopy;  Laterality: N/A;  . GIVENS CAPSULE STUDY N/A 06/21/2016   Procedure: GIVENS CAPSULE STUDY;  Surgeon: Juanita Craver, MD;  Location: [facility];  Service: Endoscopy;  Laterality: N/A;  . KNEE ARTHROSCOPY      FAMILY HISTORY: Family History  Problem Relation Age of Onset  . Diabetes Mother   . Lymphoma Father   . Leukemia Maternal Uncle   . Cancer Paternal Grandfather        stomach  . Heart disease Sister     SOCIAL HISTORY: Social History   Socioeconomic History  . Marital status: Married    Spouse name: Sarah House  . Number of children: 1  . Years of education: 2  . Highest education level: Not on file  Occupational History    Comment: customer service rep [employer]  Social Needs  . Financial resource strain: Not on file  . Food insecurity:    Worry: Not on file    Inability: Not on file  . Transportation needs:    Medical: Not on file    Non-medical: Not on file  Tobacco Use  . Smoking status: Never Smoker  . Smokeless tobacco: Never Used  Substance and Sexual Activity  . Alcohol use: No    Alcohol/week: 0.0 standard drinks  . Drug use: No  . Sexual activity: Not on file  Lifestyle  . Physical activity:    Days per  week: Not on file    Minutes per session: Not on file  . Stress: Not on file  Relationships  . Social connections:    Talks on phone: Not on file    Gets together: Not on file    Attends religious service: Not on file    Active member of club or organization: Not on file    Attends meetings of clubs or organizations: Not on file    Relationship status: Not on file  . Intimate partner violence:    Fear of current or ex partner: Not on file    Emotionally abused: Not on file    Physically abused: Not on file    Forced sexual activity: Not on  file  Other Topics Concern  . Not on file  Social History Narrative   Lives with husband   Caffeine- coffee 1 cup daily     PHYSICAL EXAM  Vitals:   01/04/19 1013  BP: (!) 143/63  Pulse: 62  Weight: (!) 300 lb 6.4 oz (136.3 kg)  Height: 5\' 7"  (1.702 m)   Body mass index is 47.05 kg/m.  Generalized: Well developed, morbidly obese female in no acute distress  Head: normocephalic and atraumatic,. Oropharynx benign  Neck: Supple,   Musculoskeletal: No deformity   Neurological examination   Mentation: Alert oriented to time, place, history taking. Attention span and concentration appropriate. Recent and remote memory intact.  Follows all commands speech and language fluent.   Cranial nerve II-XII: .Pupils were equal round reactive to light extraocular movements were full, visual field were full on confrontational test. Facial sensation and strength were normal. hearing was intact to finger rubbing bilaterally. Uvula tongue midline. head turning and shoulder shrug were normal and symmetric.Tongue protrusion into cheek strength was normal. Motor: normal bulk and tone, full strength in the BUE, BLE, Sensory: normal and symmetric to light touch,  Coordination: finger-nose-finger, heel-to-shin bilaterally, no dysmetria Gait and Station: Rising up from seated position without assistance, normal stance,  moderate stride, good arm swing, smooth  turning, able to perform tiptoe, and heel walking without difficulty. Tandem gait is steady  DIAGNOSTIC DATA (LABS, IMAGING, TESTING) - I reviewed patient records, labs, notes, testing and imaging myself where available.  Lab Results  Component Value Date   WBC 12.1 (H) 08/02/2018   HGB 11.5 (L) 08/02/2018   HCT 40.0 08/02/2018   MCV 85.1 08/02/2018   PLT 450 (H) 08/02/2018      Component Value Date/Time   NA 145 08/02/2018 1148   K 3.7 08/02/2018 1148   CL 107 08/02/2018 1148   CO2 28 08/02/2018 1148   GLUCOSE 119 (H) 08/02/2018 1148   BUN 12 08/02/2018 1148   CREATININE 0.73 08/02/2018 1148   CALCIUM 9.1 08/02/2018 1148   GFRNONAA >60 08/02/2018 1148   GFRAA >60 08/02/2018 1148    ASSESSMENT AND PLAN  63 y.o. year old female here to follow-up for newly diagnosed obstructive sleep apnea here for CPAP compliance.Data dated 12/04/2018 to 01/02/2019 shows compliance greater than 4 hours at 100%.  Average usage 8 hours 51 minutes.  Set pressure 7 to 13 cm.  Sarah House level 3 leak 95th percentile 8.7.  AHI 3.0 ESS 0 FSS 14.   CPAP compliance 100% reviewed data with patient Continue same settings Follow-up in 6 months then yearly I spent 15 minutes in total face to face time with the patient more than 50% of which was spent counseling and coordination of care, reviewing sleep  test results performed on 10/11/2018 which show obstructive sleep apnea in the severe range reviewing medications and discussing and reviewing the diagnosis of obstructive sleep apnea and compliance related to insurance and use of the machine. Dennie Bible, [facility], [facility], APRN  [facility] [address], [place], [place] [address] [phone]  I reviewed the above note and documentation by the Nurse Practitioner and agree with the history, physical exam, assessment and plan as outlined above. I was immediately available for face-to-face consultation. Star Age, MD, PhD [facility])

## 2019-01-04 ENCOUNTER — Ambulatory Visit (INDEPENDENT_AMBULATORY_CARE_PROVIDER_SITE_OTHER): Payer: Managed Care, Other (non HMO) | Admitting: Nurse Practitioner

## 2019-01-04 ENCOUNTER — Other Ambulatory Visit: Payer: Self-pay

## 2019-01-04 ENCOUNTER — Encounter: Payer: Self-pay | Admitting: Nurse Practitioner

## 2019-01-04 DIAGNOSIS — Z9989 Dependence on other enabling machines and devices: Secondary | ICD-10-CM

## 2019-01-04 DIAGNOSIS — G4733 Obstructive sleep apnea (adult) (pediatric): Secondary | ICD-10-CM

## 2019-01-04 NOTE — Patient Instructions (Signed)
CPAP compliance 100% reviewed data with patient Continue same settings Follow-up in 6 months then yearly

## 2019-02-13 ENCOUNTER — Encounter: Payer: Self-pay | Admitting: Podiatry

## 2019-02-13 ENCOUNTER — Other Ambulatory Visit: Payer: Self-pay

## 2019-02-13 ENCOUNTER — Ambulatory Visit (INDEPENDENT_AMBULATORY_CARE_PROVIDER_SITE_OTHER): Payer: Managed Care, Other (non HMO) | Admitting: Podiatry

## 2019-02-13 VITALS — Temp 97.9°F

## 2019-02-13 DIAGNOSIS — B351 Tinea unguium: Secondary | ICD-10-CM

## 2019-02-13 DIAGNOSIS — M79675 Pain in left toe(s): Secondary | ICD-10-CM | POA: Diagnosis not present

## 2019-02-13 DIAGNOSIS — E119 Type 2 diabetes mellitus without complications: Secondary | ICD-10-CM

## 2019-02-13 DIAGNOSIS — M79674 Pain in right toe(s): Secondary | ICD-10-CM

## 2019-02-13 NOTE — Patient Instructions (Signed)
Diabetes Mellitus and Foot Care  Foot care is an important part of your health, especially when you have diabetes. Diabetes may cause you to have problems because of poor blood flow (circulation) to your feet and legs, which can cause your skin to:   Become thinner and drier.   Break more easily.   Heal more slowly.   Peel and crack.  You may also have nerve damage (neuropathy) in your legs and feet, causing decreased feeling in them. This means that you may not notice minor injuries to your feet that could lead to more serious problems. Noticing and addressing any potential problems early is the best way to prevent future foot problems.  How to care for your feet  Foot hygiene   Wash your feet daily with warm water and mild soap. Do not use hot water. Then, pat your feet and the areas between your toes until they are completely dry. Do not soak your feet as this can dry your skin.   Trim your toenails straight across. Do not dig under them or around the cuticle. File the edges of your nails with an emery board or nail file.   Apply a moisturizing lotion or petroleum jelly to the skin on your feet and to dry, brittle toenails. Use lotion that does not contain alcohol and is unscented. Do not apply lotion between your toes.  Shoes and socks   Wear clean socks or stockings every day. Make sure they are not too tight. Do not wear knee-high stockings since they may decrease blood flow to your legs.   Wear shoes that fit properly and have enough cushioning. Always look in your shoes before you put them on to be sure there are no objects inside.   To break in new shoes, wear them for just a few hours a day. This prevents injuries on your feet.  Wounds, scrapes, corns, and calluses   Check your feet daily for blisters, cuts, bruises, sores, and redness. If you cannot see the bottom of your feet, use a mirror or ask someone for help.   Do not cut corns or calluses or try to remove them with medicine.   If you  find a minor scrape, cut, or break in the skin on your feet, keep it and the skin around it clean and dry. You may clean these areas with mild soap and water. Do not clean the area with peroxide, alcohol, or iodine.   If you have a wound, scrape, corn, or callus on your foot, look at it several times a day to make sure it is healing and not infected. Check for:  ? Redness, swelling, or pain.  ? Fluid or blood.  ? Warmth.  ? Pus or a bad smell.  General instructions   Do not cross your legs. This may decrease blood flow to your feet.   Do not use heating pads or hot water bottles on your feet. They may burn your skin. If you have lost feeling in your feet or legs, you may not know this is happening until it is too late.   Protect your feet from hot and cold by wearing shoes, such as at the beach or on hot pavement.   Schedule a complete foot exam at least once a year (annually) or more often if you have foot problems. If you have foot problems, report any cuts, sores, or bruises to your health care provider immediately.  Contact a health care provider if:     You have a medical condition that increases your risk of infection and you have any cuts, sores, or bruises on your feet.   You have an injury that is not healing.   You have redness on your legs or feet.   You feel burning or tingling in your legs or feet.   You have pain or cramps in your legs and feet.   Your legs or feet are numb.   Your feet always feel cold.   You have pain around a toenail.  Get help right away if:   You have a wound, scrape, corn, or callus on your foot and:  ? You have pain, swelling, or redness that gets worse.  ? You have fluid or blood coming from the wound, scrape, corn, or callus.  ? Your wound, scrape, corn, or callus feels warm to the touch.  ? You have pus or a bad smell coming from the wound, scrape, corn, or callus.  ? You have a fever.  ? You have a red line going up your leg.  Summary   Check your feet every day  for cuts, sores, red spots, swelling, and blisters.   Moisturize feet and legs daily.   Wear shoes that fit properly and have enough cushioning.   If you have foot problems, report any cuts, sores, or bruises to your health care provider immediately.   Schedule a complete foot exam at least once a year (annually) or more often if you have foot problems.  This information is not intended to replace advice given to you by your health care provider. Make sure you discuss any questions you have with your health care provider.  Document Released: 10/08/2000 Document Revised: 11/23/2017 Document Reviewed: 11/12/2016  Elsevier Interactive Patient Education  2019 [employer]

## 2019-02-13 NOTE — Progress Notes (Signed)
Subjective: Sarah House presents today with history of diabetic neuropathy with cc of painful, mycotic toenails.  Pain is aggravated when wearing enclosed shoe gear and relieved with periodic professional debridement.  [name], MD is Sarah House PCP and last visit was December of 2019.  She states she has purchased several pairs of compression hose and wear them daily to manage Sarah House edema.    Current Outpatient Medications:  .  amLODipine (NORVASC) 5 MG tablet, Take 5 mg by mouth daily., Disp: , Rfl:  .  aspirin EC 81 MG tablet, Take 81 mg by mouth daily., Disp: , Rfl:  .  atorvastatin (LIPITOR) 20 MG tablet, Take 20 mg by mouth daily., Disp: , Rfl:  .  B-D ULTRAFINE III SHORT PEN 31G X 8 MM MISC, , Disp: , Rfl:  .  Calcium Carbonate-Vitamin D (CALCIUM 500 + D PO), Take 1 tablet by mouth daily., Disp: , Rfl:  .  cyclobenzaprine (FLEXERIL) 10 MG tablet, Take 10 mg by mouth 3 (three) times daily as needed for muscle spasms. , Disp: , Rfl:  .  glimepiride (AMARYL) 4 MG tablet, Take 8 mg by mouth daily with breakfast., Disp: , Rfl:  .  hydrochlorothiazide (HYDRODIURIL) 25 MG tablet, , Disp: , Rfl:  .  Insulin Degludec (TRESIBA FLEXTOUCH) 200 UNIT/ML SOPN, Inject 36 Units into the skin at bedtime. , Disp: , Rfl:  .  insulin lispro (HUMALOG KWIKPEN) 100 UNIT/ML KiwkPen, Inject 6 Units into the skin. , Disp: , Rfl:  .  losartan-hydrochlorothiazide (HYZAAR) 100-25 MG per tablet, Take 1 tablet by mouth daily., Disp: , Rfl:  .  meloxicam (MOBIC) 15 MG tablet, Take 15 mg by mouth daily as needed for pain. , Disp: , Rfl:  .  metFORMIN (GLUCOPHAGE) 500 MG tablet, Take 1,000 mg by mouth 2 (two) times daily with a meal. , Disp: , Rfl:  .  metFORMIN (GLUCOPHAGE-XR) 500 MG 24 hr tablet, , Disp: , Rfl:  .  METROGEL 1 % gel, Apply 1 application topically daily as needed (Apply to affected area.). , Disp: , Rfl:  .  Multiple Vitamin (MULTIVITAMIN WITH MINERALS) TABS tablet, Take 1 tablet by mouth daily.,  Disp: , Rfl:  .  olmesartan (BENICAR) 40 MG tablet, , Disp: , Rfl:  .  omeprazole (PRILOSEC) 10 MG capsule, Take 10 mg by mouth daily., Disp: , Rfl:  .  tiZANidine (ZANAFLEX) 4 MG tablet, Take 4 mg by mouth as needed., Disp: , Rfl: 0  No Known Allergies  Objective:  Vascular Examination: Capillary refill time <3 seconds x 10 digits.  Dorsalis pedis 1/4 b/l.  Posterior tibial pulses 1/4 b/l.  Digital hair x 10 digits was absent.  Skin temperature gradient WNL b/l.  Bilateral ankle/pedal edema with mild varicosities. No increased warmth. No pain with calf compression.  Dermatological Examination: Skin with normal turgor, texture and tone b/l.  Toenails 1-5 b/l discolored, thick, dystrophic with subungual debris and pain with palpation to nailbeds due to thickness of nails.  Musculoskeletal: Muscle strength 5/5 to all muscle groups b/l.  Neurological: Sensation with 10 gram monofilament is absent b/l.  Vibratory sensation absent b/l.  Assessment: 1. Painful onychomycosis toenails 1-5 b/l 2. NIDDM with neuropathy  Plan: 1. Toenails 1-5 b/l were debrided in length and girth without iatrogenic bleeding. 2. Patient to continue soft, supportive shoe gear. 3. Continue compression hose daily for LE edema.  4. Patient to report any pedal injuries to medical professional  5. Follow up 3 months.  6. Patient/POA to call should there be a concern in the interim.

## 2019-05-15 ENCOUNTER — Ambulatory Visit (INDEPENDENT_AMBULATORY_CARE_PROVIDER_SITE_OTHER): Payer: Managed Care, Other (non HMO) | Admitting: Podiatry

## 2019-05-15 ENCOUNTER — Other Ambulatory Visit: Payer: Self-pay

## 2019-05-15 ENCOUNTER — Encounter: Payer: Self-pay | Admitting: Podiatry

## 2019-05-15 DIAGNOSIS — B351 Tinea unguium: Secondary | ICD-10-CM

## 2019-05-15 DIAGNOSIS — M79674 Pain in right toe(s): Secondary | ICD-10-CM

## 2019-05-15 DIAGNOSIS — M79675 Pain in left toe(s): Secondary | ICD-10-CM | POA: Diagnosis not present

## 2019-05-15 NOTE — Patient Instructions (Signed)
Diabetes Mellitus and Foot Care Foot care is an important part of your health, especially when you have diabetes. Diabetes may cause you to have problems because of poor blood flow (circulation) to your feet and legs, which can cause your skin to:  Become thinner and drier.  Break more easily.  Heal more slowly.  Peel and crack. You may also have nerve damage (neuropathy) in your legs and feet, causing decreased feeling in them. This means that you may not notice minor injuries to your feet that could lead to more serious problems. Noticing and addressing any potential problems early is the best way to prevent future foot problems. How to care for your feet Foot hygiene  Wash your feet daily with warm water and mild soap. Do not use hot water. Then, pat your feet and the areas between your toes until they are completely dry. Do not soak your feet as this can dry your skin.  Trim your toenails straight across. Do not dig under them or around the cuticle. File the edges of your nails with an emery board or nail file.  Apply a moisturizing lotion or petroleum jelly to the skin on your feet and to dry, brittle toenails. Use lotion that does not contain alcohol and is unscented. Do not apply lotion between your toes. Shoes and socks  Wear clean socks or stockings every day. Make sure they are not too tight. Do not wear knee-high stockings since they may decrease blood flow to your legs.  Wear shoes that fit properly and have enough cushioning. Always look in your shoes before you put them on to be sure there are no objects inside.  To break in new shoes, wear them for just a few hours a day. This prevents injuries on your feet. Wounds, scrapes, corns, and calluses  Check your feet daily for blisters, cuts, bruises, sores, and redness. If you cannot see the bottom of your feet, use a mirror or ask someone for help.  Do not cut corns or calluses or try to remove them with medicine.  If you  find a minor scrape, cut, or break in the skin on your feet, keep it and the skin around it clean and dry. You may clean these areas with mild soap and water. Do not clean the area with peroxide, alcohol, or iodine.  If you have a wound, scrape, corn, or callus on your foot, look at it several times a day to make sure it is healing and not infected. Check for: ? Redness, swelling, or pain. ? Fluid or blood. ? Warmth. ? Pus or a bad smell. General instructions  Do not cross your legs. This may decrease blood flow to your feet.  Do not use heating pads or hot water bottles on your feet. They may burn your skin. If you have lost feeling in your feet or legs, you may not know this is happening until it is too late.  Protect your feet from hot and cold by wearing shoes, such as at the beach or on hot pavement.  Schedule a complete foot exam at least once a year (annually) or more often if you have foot problems. If you have foot problems, report any cuts, sores, or bruises to your health care provider immediately. Contact a health care provider if:  You have a medical condition that increases your risk of infection and you have any cuts, sores, or bruises on your feet.  You have an injury that is not   healing.  You have redness on your legs or feet.  You feel burning or tingling in your legs or feet.  You have pain or cramps in your legs and feet.  Your legs or feet are numb.  Your feet always feel cold.  You have pain around a toenail. Get help right away if:  You have a wound, scrape, corn, or callus on your foot and: ? You have pain, swelling, or redness that gets worse. ? You have fluid or blood coming from the wound, scrape, corn, or callus. ? Your wound, scrape, corn, or callus feels warm to the touch. ? You have pus or a bad smell coming from the wound, scrape, corn, or callus. ? You have a fever. ? You have a red line going up your leg. Summary  Check your feet every day  for cuts, sores, red spots, swelling, and blisters.  Moisturize feet and legs daily.  Wear shoes that fit properly and have enough cushioning.  If you have foot problems, report any cuts, sores, or bruises to your health care provider immediately.  Schedule a complete foot exam at least once a year (annually) or more often if you have foot problems. This information is not intended to replace advice given to you by your health care provider. Make sure you discuss any questions you have with your health care provider. Document Released: 10/08/2000 Document Revised: 11/23/2017 Document Reviewed: 11/12/2016 Elsevier Patient Education  2020 [employer]   Onychomycosis/Fungal Toenails  WHAT IS IT? An infection that lies within the keratin of your nail plate that is caused by a fungus.  WHY ME? Fungal infections affect all ages, sexes, races, and creeds.  There may be many factors that predispose you to a fungal infection such as age, coexisting medical conditions such as diabetes, or an autoimmune disease; stress, medications, fatigue, genetics, etc.  Bottom line: fungus thrives in a warm, moist environment and your shoes offer such a location.  IS IT CONTAGIOUS? Theoretically, yes.  You do not want to share shoes, nail clippers or files with someone who has fungal toenails.  Walking around barefoot in the same room or sleeping in the same bed is unlikely to transfer the organism.  It is important to realize, however, that fungus can spread easily from one nail to the next on the same foot.  HOW DO WE TREAT THIS?  There are several ways to treat this condition.  Treatment may depend on many factors such as age, medications, pregnancy, liver and kidney conditions, etc.  It is best to ask your doctor which options are available to you.  1. No treatment.   Unlike many other medical concerns, you can live with this condition.  However for many people this can be a painful condition and may lead to  ingrown toenails or a bacterial infection.  It is recommended that you keep the nails cut short to help reduce the amount of fungal nail. 2. Topical treatment.  These range from herbal remedies to prescription strength nail lacquers.  About 40-50% effective, topicals require twice daily application for approximately 9 to 12 months or until an entirely new nail has grown out.  The most effective topicals are medical grade medications available through physicians offices. 3. Oral antifungal medications.  With an 80-90% cure rate, the most common oral medication requires 3 to 4 months of therapy and stays in your system for a year as the new nail grows out.  Oral antifungal medications do require   blood work to make sure it is a safe drug for you.  A liver function panel will be performed prior to starting the medication and after the first month of treatment.  It is important to have the blood work performed to avoid any harmful side effects.  In general, this medication safe but blood work is required. 4. Laser Therapy.  This treatment is performed by applying a specialized laser to the affected nail plate.  This therapy is noninvasive, fast, and non-painful.  It is not covered by insurance and is therefore, out of pocket.  The results have been very good with a 80-95% cure rate.  [facility] is the only practice in the area to offer this therapy. 5. Permanent Nail Avulsion.  Removing the entire nail so that a new nail will not grow back. 

## 2019-05-20 NOTE — Progress Notes (Signed)
Subjective:  Sarah House presents to clinic today with cc of  painful, thick, discolored, elongated toenails 1-5 b/l that become tender and cannot cut because of thickness. Pain is aggravated when wearing enclosed shoe gear.  She voices no new pedal concerns on today's visit.   Current Outpatient Medications:  .  amLODipine (NORVASC) 5 MG tablet, Take 5 mg by mouth daily., Disp: , Rfl:  .  aspirin EC 81 MG tablet, Take 81 mg by mouth daily., Disp: , Rfl:  .  atorvastatin (LIPITOR) 20 MG tablet, Take 20 mg by mouth daily., Disp: , Rfl:  .  B-D ULTRAFINE III SHORT PEN 31G X 8 MM MISC, , Disp: , Rfl:  .  Calcium Carbonate-Vitamin D (CALCIUM 500 + D PO), Take 1 tablet by mouth daily., Disp: , Rfl:  .  cyclobenzaprine (FLEXERIL) 10 MG tablet, Take 10 mg by mouth 3 (three) times daily as needed for muscle spasms. , Disp: , Rfl:  .  glimepiride (AMARYL) 4 MG tablet, Take 8 mg by mouth daily with breakfast., Disp: , Rfl:  .  hydrochlorothiazide (HYDRODIURIL) 25 MG tablet, , Disp: , Rfl:  .  Insulin Degludec (TRESIBA FLEXTOUCH) 200 UNIT/ML SOPN, Inject 36 Units into the skin at bedtime. , Disp: , Rfl:  .  insulin lispro (HUMALOG KWIKPEN) 100 UNIT/ML KiwkPen, Inject 6 Units into the skin. , Disp: , Rfl:  .  losartan-hydrochlorothiazide (HYZAAR) 100-25 MG per tablet, Take 1 tablet by mouth daily., Disp: , Rfl:  .  meloxicam (MOBIC) 15 MG tablet, Take 15 mg by mouth daily as needed for pain. , Disp: , Rfl:  .  metFORMIN (GLUCOPHAGE) 500 MG tablet, Take 1,000 mg by mouth 2 (two) times daily with a meal. , Disp: , Rfl:  .  metFORMIN (GLUCOPHAGE-XR) 500 MG 24 hr tablet, , Disp: , Rfl:  .  METROGEL 1 % gel, Apply 1 application topically daily as needed (Apply to affected area.). , Disp: , Rfl:  .  Multiple Vitamin (MULTIVITAMIN WITH MINERALS) TABS tablet, Take 1 tablet by mouth daily., Disp: , Rfl:  .  olmesartan (BENICAR) 40 MG tablet, , Disp: , Rfl:  .  omeprazole (PRILOSEC) 10 MG capsule, Take 10 mg  by mouth daily., Disp: , Rfl:  .  tiZANidine (ZANAFLEX) 4 MG tablet, Take 4 mg by mouth as needed., Disp: , Rfl: 0   No Known Allergies   Objective:  Physical Examination:  Vascular Examination: Capillary refill time <3 seconds x 10 digits.  Palpable DP/PT pulses 1/4 b/l.  Digital hair was absent b/l.  Bilateral edema noted both feet/ankles.  Skin temperature gradient WNL b/l.  Dermatological Examination: Skin with normal turgor, texture and tone b/l.  No open wounds b/l.  No interdigital macerations noted b/l.  Elongated, thick, discolored brittle toenails with subungual debris and pain on dorsal palpation of nailbeds 1-5 b/l.  Musculoskeletal Examination: Muscle strength 5/5 to all muscle groups b/l  No pain, crepitus or joint discomfort with active/passive ROM.  Neurological Examination: Sensation absent b/l with 10 gram monofilament.  Vibratory sensation absent b/l.  Assessment: Mycotic nail infection with pain 1-5 b/l  Plan: 1. Toenails 1-5 b/l were debrided in length and girth without iatrogenic laceration. 2.  Continue soft, supportive shoe gear daily. 3.  Report any pedal injuries to medical professional. 4.  Follow up 3 months. 5.  Patient/POA to call should there be a question/concern in there interim.

## 2019-08-17 ENCOUNTER — Encounter: Payer: Self-pay | Admitting: Podiatry

## 2019-08-17 ENCOUNTER — Ambulatory Visit (INDEPENDENT_AMBULATORY_CARE_PROVIDER_SITE_OTHER): Payer: Managed Care, Other (non HMO) | Admitting: Podiatry

## 2019-08-17 ENCOUNTER — Other Ambulatory Visit: Payer: Self-pay

## 2019-08-17 DIAGNOSIS — M79675 Pain in left toe(s): Secondary | ICD-10-CM

## 2019-08-17 DIAGNOSIS — M79674 Pain in right toe(s): Secondary | ICD-10-CM

## 2019-08-17 DIAGNOSIS — B351 Tinea unguium: Secondary | ICD-10-CM

## 2019-08-17 DIAGNOSIS — E1142 Type 2 diabetes mellitus with diabetic polyneuropathy: Secondary | ICD-10-CM | POA: Diagnosis not present

## 2019-08-19 NOTE — Progress Notes (Signed)
Subjective: Sarah House is seen today for follow up painful, elongated, thickened toenails 1-5 b/l feet that she cannot cut. Pain interferes with daily activities. Aggravating factor includes wearing enclosed shoe gear and relieved with periodic debridement.  Patient states she has been wearing compression knee hi's 20-30 mmHg daily and notices improvement with her lower extremity swelling.  Current Outpatient Medications on File Prior to Visit  Medication Sig  . amLODipine (NORVASC) 5 MG tablet Take 5 mg by mouth daily.  Marland Kitchen aspirin EC 81 MG tablet Take 81 mg by mouth daily.  Marland Kitchen atorvastatin (LIPITOR) 20 MG tablet Take 20 mg by mouth daily.  . B-D ULTRAFINE III SHORT PEN 31G X 8 MM MISC   . Calcium Carbonate-Vitamin D (CALCIUM 500 + D PO) Take 1 tablet by mouth daily.  . cyclobenzaprine (FLEXERIL) 10 MG tablet Take 10 mg by mouth 3 (three) times daily as needed for muscle spasms.   Marland Kitchen glimepiride (AMARYL) 4 MG tablet Take 8 mg by mouth daily with breakfast.  . hydrochlorothiazide (HYDRODIURIL) 25 MG tablet   . Insulin Degludec (TRESIBA FLEXTOUCH) 200 UNIT/ML SOPN Inject 36 Units into the skin at bedtime.   . insulin lispro (HUMALOG KWIKPEN) 100 UNIT/ML KiwkPen Inject 6 Units into the skin.   Marland Kitchen losartan-hydrochlorothiazide (HYZAAR) 100-25 MG per tablet Take 1 tablet by mouth daily.  . meloxicam (MOBIC) 15 MG tablet Take 15 mg by mouth daily as needed for pain.   . metFORMIN (GLUCOPHAGE) 500 MG tablet Take 1,000 mg by mouth 2 (two) times daily with a meal.   . metFORMIN (GLUCOPHAGE-XR) 500 MG 24 hr tablet   . METROGEL 1 % gel Apply 1 application topically daily as needed (Apply to affected area.).   Marland Kitchen Multiple Vitamin (MULTIVITAMIN WITH MINERALS) TABS tablet Take 1 tablet by mouth daily.  Marland Kitchen olmesartan (BENICAR) 40 MG tablet   . omeprazole (PRILOSEC) 10 MG capsule Take 10 mg by mouth daily.  Marland Kitchen tiZANidine (ZANAFLEX) 4 MG tablet Take 4 mg by mouth as needed.   No current facility-administered  medications on file prior to visit.      No Known Allergies   Objective:  Vascular Examination: Capillary refill time <3 seconds b/l.  Dorsalis pedis and Posterior tibial pulses 1/4 b/l.  Digital hair absent b/l.  Skin temperature gradient WNL b/l.   Dermatological Examination: Skin with normal turgor, texture and tone b/l.  Toenails 1-5 b/l discolored, thick, dystrophic with subungual debris and pain with palpation to nailbeds due to thickness of nails.  Musculoskeletal: Muscle strength 5/5 to all LE muscle groups b/l.   No gross bony deformities b/l.  No pain, crepitus or joint limitation noted with ROM.   Neurological Examination: Protective sensation absent b/l with 10 gram monofilament bilaterally.  Vibratory sensation absent b/l.  Assessment: Painful onychomycosis toenails 1-5 b/l  NIDDM with neuropathy  Plan: 1. Toenails 1-5 b/l were debrided in length and girth without iatrogenic bleeding. 2. Patient to continue soft, supportive shoe gear 3. Patient to report any pedal injuries to medical professional immediately. 4. Follow up 3 months.  5. Patient/POA to call should there be a concern in the interim.

## 2019-11-23 ENCOUNTER — Ambulatory Visit: Payer: Managed Care, Other (non HMO) | Admitting: Podiatry

## 2019-12-26 DIAGNOSIS — G4733 Obstructive sleep apnea (adult) (pediatric): Secondary | ICD-10-CM | POA: Diagnosis not present

## 2019-12-26 DIAGNOSIS — E1065 Type 1 diabetes mellitus with hyperglycemia: Secondary | ICD-10-CM | POA: Diagnosis not present

## 2019-12-26 DIAGNOSIS — R718 Other abnormality of red blood cells: Secondary | ICD-10-CM | POA: Diagnosis not present

## 2019-12-26 DIAGNOSIS — Z79899 Other long term (current) drug therapy: Secondary | ICD-10-CM | POA: Diagnosis not present

## 2020-01-02 DIAGNOSIS — E782 Mixed hyperlipidemia: Secondary | ICD-10-CM | POA: Diagnosis not present

## 2020-01-02 DIAGNOSIS — E1065 Type 1 diabetes mellitus with hyperglycemia: Secondary | ICD-10-CM | POA: Diagnosis not present

## 2020-01-02 DIAGNOSIS — I10 Essential (primary) hypertension: Secondary | ICD-10-CM | POA: Diagnosis not present

## 2020-01-02 DIAGNOSIS — Z0000 Encounter for general adult medical examination without abnormal findings: Secondary | ICD-10-CM | POA: Diagnosis not present

## 2020-01-04 DIAGNOSIS — G4733 Obstructive sleep apnea (adult) (pediatric): Secondary | ICD-10-CM | POA: Diagnosis not present

## 2020-01-16 DIAGNOSIS — R0781 Pleurodynia: Secondary | ICD-10-CM | POA: Diagnosis not present

## 2020-01-16 DIAGNOSIS — R079 Chest pain, unspecified: Secondary | ICD-10-CM | POA: Diagnosis not present

## 2020-01-16 DIAGNOSIS — R918 Other nonspecific abnormal finding of lung field: Secondary | ICD-10-CM | POA: Diagnosis not present

## 2020-01-21 ENCOUNTER — Ambulatory Visit: Payer: Medicare Other | Admitting: Podiatry

## 2020-01-21 ENCOUNTER — Encounter: Payer: Self-pay | Admitting: Podiatry

## 2020-01-21 ENCOUNTER — Other Ambulatory Visit: Payer: Self-pay

## 2020-01-21 VITALS — Temp 97.4°F

## 2020-01-21 DIAGNOSIS — B351 Tinea unguium: Secondary | ICD-10-CM | POA: Diagnosis not present

## 2020-01-21 DIAGNOSIS — E119 Type 2 diabetes mellitus without complications: Secondary | ICD-10-CM

## 2020-01-21 DIAGNOSIS — M79675 Pain in left toe(s): Secondary | ICD-10-CM | POA: Diagnosis not present

## 2020-01-21 DIAGNOSIS — M79674 Pain in right toe(s): Secondary | ICD-10-CM

## 2020-01-21 DIAGNOSIS — M2141 Flat foot [pes planus] (acquired), right foot: Secondary | ICD-10-CM | POA: Diagnosis not present

## 2020-01-21 DIAGNOSIS — R600 Localized edema: Secondary | ICD-10-CM | POA: Diagnosis not present

## 2020-01-21 DIAGNOSIS — E1142 Type 2 diabetes mellitus with diabetic polyneuropathy: Secondary | ICD-10-CM | POA: Diagnosis not present

## 2020-01-21 DIAGNOSIS — M2142 Flat foot [pes planus] (acquired), left foot: Secondary | ICD-10-CM

## 2020-01-21 NOTE — Patient Instructions (Signed)
Diabetes Mellitus and Foot Care Foot care is an important part of your health, especially when you have diabetes. Diabetes may cause you to have problems because of poor blood flow (circulation) to your feet and legs, which can cause your skin to:  Become thinner and drier.  Break more easily.  Heal more slowly.  Peel and crack. You may also have nerve damage (neuropathy) in your legs and feet, causing decreased feeling in them. This means that you may not notice minor injuries to your feet that could lead to more serious problems. Noticing and addressing any potential problems early is the best way to prevent future foot problems. How to care for your feet Foot hygiene  Wash your feet daily with warm water and mild soap. Do not use hot water. Then, pat your feet and the areas between your toes until they are completely dry. Do not soak your feet as this can dry your skin.  Trim your toenails straight across. Do not dig under them or around the cuticle. File the edges of your nails with an emery board or nail file.  Apply a moisturizing lotion or petroleum jelly to the skin on your feet and to dry, brittle toenails. Use lotion that does not contain alcohol and is unscented. Do not apply lotion between your toes. Shoes and socks  Wear clean socks or stockings every day. Make sure they are not too tight. Do not wear knee-high stockings since they may decrease blood flow to your legs.  Wear shoes that fit properly and have enough cushioning. Always look in your shoes before you put them on to be sure there are no objects inside.  To break in new shoes, wear them for just a few hours a day. This prevents injuries on your feet. Wounds, scrapes, corns, and calluses  Check your feet daily for blisters, cuts, bruises, sores, and redness. If you cannot see the bottom of your feet, use a mirror or ask someone for help.  Do not cut corns or calluses or try to remove them with medicine.  If you  find a minor scrape, cut, or break in the skin on your feet, keep it and the skin around it clean and dry. You may clean these areas with mild soap and water. Do not clean the area with peroxide, alcohol, or iodine.  If you have a wound, scrape, corn, or callus on your foot, look at it several times a day to make sure it is healing and not infected. Check for: ? Redness, swelling, or pain. ? Fluid or blood. ? Warmth. ? Pus or a bad smell. General instructions  Do not cross your legs. This may decrease blood flow to your feet.  Do not use heating pads or hot water bottles on your feet. They may burn your skin. If you have lost feeling in your feet or legs, you may not know this is happening until it is too late.  Protect your feet from hot and cold by wearing shoes, such as at the beach or on hot pavement.  Schedule a complete foot exam at least once a year (annually) or more often if you have foot problems. If you have foot problems, report any cuts, sores, or bruises to your health care provider immediately. Contact a health care provider if:  You have a medical condition that increases your risk of infection and you have any cuts, sores, or bruises on your feet.  You have an injury that is not   healing.  You have redness on your legs or feet.  You feel burning or tingling in your legs or feet.  You have pain or cramps in your legs and feet.  Your legs or feet are numb.  Your feet always feel cold.  You have pain around a toenail. Get help right away if:  You have a wound, scrape, corn, or callus on your foot and: ? You have pain, swelling, or redness that gets worse. ? You have fluid or blood coming from the wound, scrape, corn, or callus. ? Your wound, scrape, corn, or callus feels warm to the touch. ? You have pus or a bad smell coming from the wound, scrape, corn, or callus. ? You have a fever. ? You have a red line going up your leg. Summary  Check your feet every day  for cuts, sores, red spots, swelling, and blisters.  Moisturize feet and legs daily.  Wear shoes that fit properly and have enough cushioning.  If you have foot problems, report any cuts, sores, or bruises to your health care provider immediately.  Schedule a complete foot exam at least once a year (annually) or more often if you have foot problems. This information is not intended to replace advice given to you by your health care provider. Make sure you discuss any questions you have with your health care provider. Document Revised: 07/04/2019 Document Reviewed: 11/12/2016 Elsevier Patient Education  2020 [employer]  Onychomycosis/Fungal Toenails  WHAT IS IT? An infection that lies within the keratin of your nail plate that is caused by a fungus.  WHY ME? Fungal infections affect all ages, sexes, races, and creeds.  There may be many factors that predispose you to a fungal infection such as age, coexisting medical conditions such as diabetes, or an autoimmune disease; stress, medications, fatigue, genetics, etc.  Bottom line: fungus thrives in a warm, moist environment and your shoes offer such a location.  IS IT CONTAGIOUS? Theoretically, yes.  You do not want to share shoes, nail clippers or files with someone who has fungal toenails.  Walking around barefoot in the same room or sleeping in the same bed is unlikely to transfer the organism.  It is important to realize, however, that fungus can spread easily from one nail to the next on the same foot.  HOW DO WE TREAT THIS?  There are several ways to treat this condition.  Treatment may depend on many factors such as age, medications, pregnancy, liver and kidney conditions, etc.  It is best to ask your doctor which options are available to you.  5. No treatment.   Unlike many other medical concerns, you can live with this condition.  However for many people this can be a painful condition and may lead to ingrown toenails or a bacterial  infection.  It is recommended that you keep the nails cut short to help reduce the amount of fungal nail. 6. Topical treatment.  These range from herbal remedies to prescription strength nail lacquers.  About 40-50% effective, topicals require twice daily application for approximately 9 to 12 months or until an entirely new nail has grown out.  The most effective topicals are medical grade medications available through physicians offices. 7. Oral antifungal medications.  With an 80-90% cure rate, the most common oral medication requires 3 to 4 months of therapy and stays in your system for a year as the new nail grows out.  Oral antifungal medications do require blood work to make   sure it is a safe drug for you.  A liver function panel will be performed prior to starting the medication and after the first month of treatment.  It is important to have the blood work performed to avoid any harmful side effects.  In general, this medication safe but blood work is required. 8. Laser Therapy.  This treatment is performed by applying a specialized laser to the affected nail plate.  This therapy is noninvasive, fast, and non-painful.  It is not covered by insurance and is therefore, out of pocket.  The results have been very good with a 80-95% cure rate.  [facility] is the only practice in the area to offer this therapy. 9. Permanent Nail Avulsion.  Removing the entire nail so that a new nail will not grow back. 

## 2020-01-22 NOTE — Progress Notes (Signed)
Subjective: [name] presents today for preventative diabetic foot care, for annual diabetic foot examination and painful mycotic nails b/l that are difficult to trim. Pain interferes with ambulation. Aggravating factors include wearing enclosed shoe gear. Pain is relieved with periodic professional debridement.   She is requesting diabetic shoes on today's visit. States she wears compression hose for her lower extremity swelling. She voices no other pedal problems on today's visit.  Her blood glucose was 148 mg/dl this morning.  Past Medical History:  Diagnosis Date  . Anemia   . Arthritis    knees  . Diabetes mellitus without complication ([place])   . GERD (gastroesophageal reflux disease)   . Hypercholesterolemia   . Hypertension   . Hyperthyroidism    had "radiation to kill it"    Patient Active Problem List   Diagnosis Date Noted  . Obstructive sleep apnea treated with continuous positive airway pressure (CPAP) 01/04/2019  . Uncontrolled type 2 diabetes mellitus with hyperglycemia ([place]) 03/24/2018    Past Surgical History:  Procedure Laterality Date  . BREAST LUMPECTOMY WITH RADIOACTIVE SEED LOCALIZATION Right 12/23/2015   Procedure: RIGHT BREAST LUMPECTOMY WITH RADIOACTIVE SEED LOCALIZATION;  Surgeon: Coralie Keens, MD;  Location: [place];  Service: General;  Laterality: Right;  . COLONOSCOPY W/ POLYPECTOMY    . COLONOSCOPY WITH PROPOFOL N/A 04/09/2016   Procedure: COLONOSCOPY WITH PROPOFOL;  Surgeon: Carol Ada, MD;  [facility];  Service: Endoscopy;  Laterality: N/A;  . ESOPHAGOGASTRODUODENOSCOPY (EGD) WITH PROPOFOL N/A 04/09/2016   Procedure: ESOPHAGOGASTRODUODENOSCOPY (EGD) WITH PROPOFOL;  Surgeon: Carol Ada, MD;  [facility]: [facility];  Service: Endoscopy;  Laterality: N/A;  . GIVENS CAPSULE STUDY N/A 06/21/2016   Procedure: GIVENS CAPSULE STUDY;  Surgeon: Juanita Craver, MD;  Location: [place];  Service: Endoscopy;  Laterality:  N/A;  . KNEE ARTHROSCOPY      Current Outpatient Medications on File Prior to Visit  Medication Sig Dispense Refill  . amLODipine (NORVASC) 5 MG tablet Take 5 mg by mouth daily.    Marland Kitchen aspirin EC 81 MG tablet Take 81 mg by mouth daily.    Marland Kitchen atorvastatin (LIPITOR) 20 MG tablet Take 20 mg by mouth daily.    . B-D ULTRAFINE III SHORT PEN 31G X 8 MM MISC     . Calcium Carbonate-Vitamin D (CALCIUM 500 + D PO) Take 1 tablet by mouth daily.    . cyclobenzaprine (FLEXERIL) 10 MG tablet Take 10 mg by mouth 3 (three) times daily as needed for muscle spasms.     Marland Kitchen glimepiride (AMARYL) 4 MG tablet Take 8 mg by mouth daily with breakfast.    . hydrochlorothiazide (HYDRODIURIL) 25 MG tablet     . Insulin Degludec (TRESIBA FLEXTOUCH) 200 UNIT/ML SOPN Inject 36 Units into the skin at bedtime.     . insulin lispro (HUMALOG KWIKPEN) 100 UNIT/ML KiwkPen Inject 6 Units into the skin.     Marland Kitchen losartan-hydrochlorothiazide (HYZAAR) 100-25 MG per tablet Take 1 tablet by mouth daily.    . meloxicam (MOBIC) 15 MG tablet Take 15 mg by mouth daily as needed for pain.     . metFORMIN (GLUCOPHAGE) 500 MG tablet Take 1,000 mg by mouth 2 (two) times daily with a meal.     . metFORMIN (GLUCOPHAGE-XR) 500 MG 24 hr tablet     . METROGEL 1 % gel Apply 1 application topically daily as needed (Apply to affected area.).     Marland Kitchen Multiple Vitamin (MULTIVITAMIN WITH MINERALS) TABS tablet Take  1 tablet by mouth daily.    Marland Kitchen olmesartan (BENICAR) 40 MG tablet     . omeprazole (PRILOSEC) 10 MG capsule Take 10 mg by mouth daily.    Marland Kitchen tiZANidine (ZANAFLEX) 4 MG tablet Take 4 mg by mouth as needed.  0   No current facility-administered medications on file prior to visit.     No Known Allergies  Social History   Occupational History    Comment: customer service rep [employer]  Tobacco Use  . Smoking status: Never Smoker  . Smokeless tobacco: Never Used  Substance and Sexual Activity  . Alcohol use: No    Alcohol/week: 0.0 standard  drinks  . Drug use: No  . Sexual activity: Not on file    Family History  Problem Relation Age of Onset  . Diabetes Mother   . Lymphoma Father   . Leukemia Maternal Uncle   . Cancer Paternal Grandfather        stomach  . Heart disease Sister     Immunization History  Administered Date(s) Administered  . Influenza Inj Mdck Quad Pf 09/19/2017  . Influenza,inj,Quad PF,6+ Mos 07/19/2018     Objective: Vitals:   01/21/20 0945  Temp: (!) 97.4 F (36.3 C)    [place] H Barzee is a/an 64 y.o. AA female morbidly obese in NAD. AAO X 3.  Vascular Examination: Capillary fill time to digits <3 seconds b/l. Faintly palpable pedal pulses b/l. Pedal hair absent b/l Skin temperature gradient within normal limits b/l. Lower extremity edema noted b/l L>R.  Dermatological Examination: Pedal skin with normal turgor, texture and tone bilaterally. No open wounds bilaterally. No interdigital macerations bilaterally. Toenails 1-5 b/l elongated, dystrophic, thickened, crumbly with subungual debris and tenderness to dorsal palpation.  Musculoskeletal Examination: Normal muscle strength 5/5 to all lower extremity muscle groups bilaterally, no pain crepitus or joint limitation noted with ROM b/l and pes planus deformity noted.  Neurological Examination: Protective sensation intact 5/5 intact bilaterally with 10g monofilament b/l Vibratory sensation decreased b/l negative Babinski b/l. Negative clonus b/l.  Assessment: 1. Pain due to onychomycosis of toenails of both feet   2. Pes planus of both feet   3. Localized edema   4. Diabetic peripheral neuropathy associated with type 2 diabetes mellitus ([place])   5. Encounter for diabetic foot exam ([place])     Plan: -Diabetic foot examination performed on today's visit. -Continue diabetic foot care principles. Literature dispensed on today.  -For lower extremity edema, continue compression hose daily.  -Toenails 1-5 b/l were debrided in length and girth  with sterile nail nippers and dremel without iatrogenic bleeding.  -Patient to continue soft, supportive shoe gear daily. Start procedure for diabetic shoes. Patient qualifies based on diagnoses. -Patient to report any pedal injuries to medical professional immediately. -Patient/POA to call should there be question/concern in the interim.  Return in about 3 months (around 04/22/2020) for diabetic nail trim.

## 2020-01-24 DIAGNOSIS — J40 Bronchitis, not specified as acute or chronic: Secondary | ICD-10-CM | POA: Diagnosis not present

## 2020-01-24 DIAGNOSIS — R0781 Pleurodynia: Secondary | ICD-10-CM | POA: Diagnosis not present

## 2020-03-20 DIAGNOSIS — E119 Type 2 diabetes mellitus without complications: Secondary | ICD-10-CM | POA: Diagnosis not present

## 2020-03-20 DIAGNOSIS — H5213 Myopia, bilateral: Secondary | ICD-10-CM | POA: Diagnosis not present

## 2020-03-26 DIAGNOSIS — Z780 Asymptomatic menopausal state: Secondary | ICD-10-CM | POA: Diagnosis not present

## 2020-03-26 DIAGNOSIS — Z1212 Encounter for screening for malignant neoplasm of rectum: Secondary | ICD-10-CM | POA: Diagnosis not present

## 2020-04-03 DIAGNOSIS — G4733 Obstructive sleep apnea (adult) (pediatric): Secondary | ICD-10-CM | POA: Diagnosis not present

## 2020-04-16 DIAGNOSIS — E1065 Type 1 diabetes mellitus with hyperglycemia: Secondary | ICD-10-CM | POA: Diagnosis not present

## 2020-04-16 DIAGNOSIS — E782 Mixed hyperlipidemia: Secondary | ICD-10-CM | POA: Diagnosis not present

## 2020-04-16 DIAGNOSIS — I10 Essential (primary) hypertension: Secondary | ICD-10-CM | POA: Diagnosis not present

## 2020-04-16 DIAGNOSIS — D500 Iron deficiency anemia secondary to blood loss (chronic): Secondary | ICD-10-CM | POA: Diagnosis not present

## 2020-04-22 ENCOUNTER — Other Ambulatory Visit: Payer: Self-pay

## 2020-04-22 ENCOUNTER — Encounter: Payer: Self-pay | Admitting: Podiatry

## 2020-04-22 ENCOUNTER — Ambulatory Visit: Payer: Medicare Other | Admitting: Podiatry

## 2020-04-22 DIAGNOSIS — Q828 Other specified congenital malformations of skin: Secondary | ICD-10-CM

## 2020-04-22 DIAGNOSIS — M79674 Pain in right toe(s): Secondary | ICD-10-CM

## 2020-04-22 DIAGNOSIS — E1142 Type 2 diabetes mellitus with diabetic polyneuropathy: Secondary | ICD-10-CM | POA: Diagnosis not present

## 2020-04-22 DIAGNOSIS — M79675 Pain in left toe(s): Secondary | ICD-10-CM

## 2020-04-22 DIAGNOSIS — B351 Tinea unguium: Secondary | ICD-10-CM

## 2020-04-23 DIAGNOSIS — E782 Mixed hyperlipidemia: Secondary | ICD-10-CM | POA: Diagnosis not present

## 2020-04-23 DIAGNOSIS — E1065 Type 1 diabetes mellitus with hyperglycemia: Secondary | ICD-10-CM | POA: Diagnosis not present

## 2020-04-23 DIAGNOSIS — D500 Iron deficiency anemia secondary to blood loss (chronic): Secondary | ICD-10-CM | POA: Diagnosis not present

## 2020-04-23 DIAGNOSIS — I10 Essential (primary) hypertension: Secondary | ICD-10-CM | POA: Diagnosis not present

## 2020-04-26 IMAGING — CT CT HEAD W/O CM
3 of 4 series · 15 of 47 positions shown, 18 images · non-contrast
Comparison: None.

CLINICAL DATA: Headache for 2 weeks.  Confusion.

EXAM:
CT HEAD WITHOUT CONTRAST
TECHNIQUE: Contiguous axial images were obtained from the base of the skull
through the vertex without intravenous contrast.

[Series 2: head 5.00 hr40 s3 ibhc · axial · 0.44mm/px · z∈[-663,-518]mm · 9 of 35 slices shown, 12 images]
[im 3/35  brain]
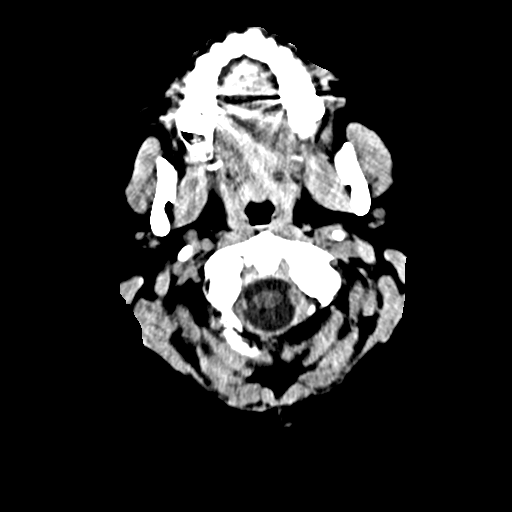
[im 3/35  bone]
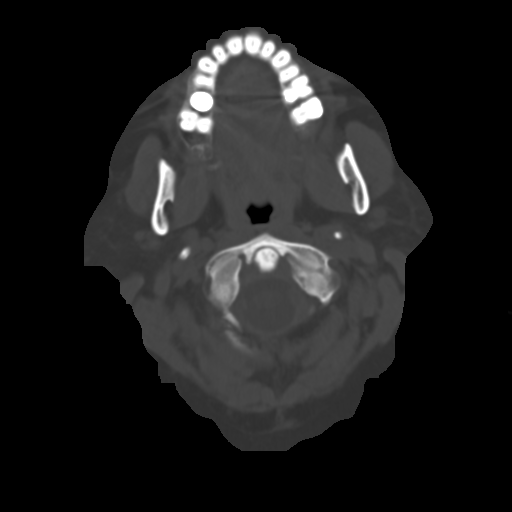
[im 8/35  brain]
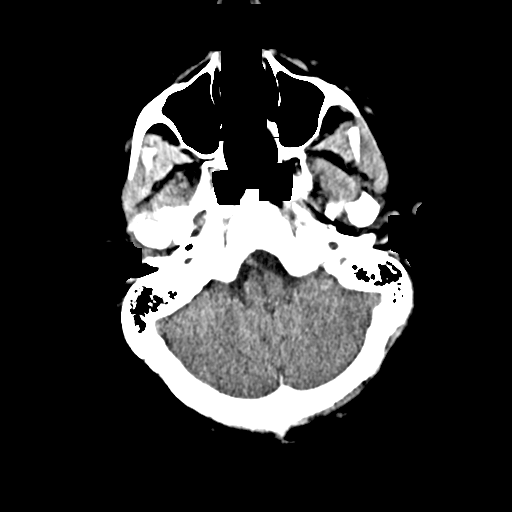
[im 10/35  brain]
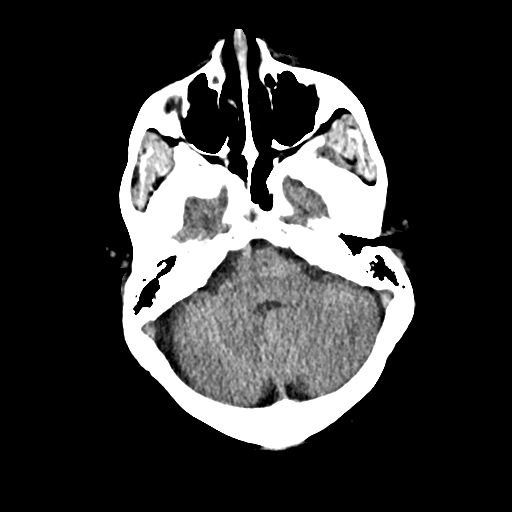
[im 15/35  brain]
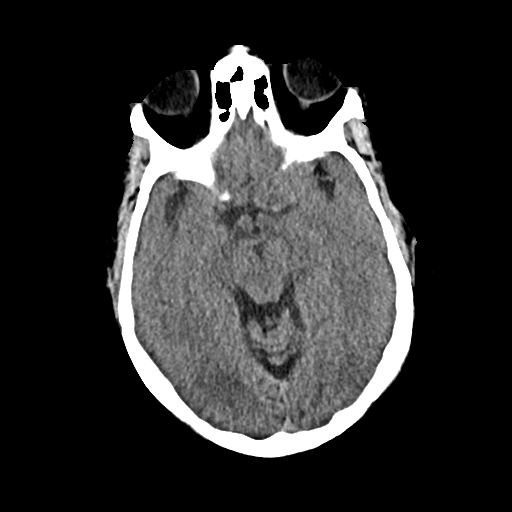
[im 18/35  brain]
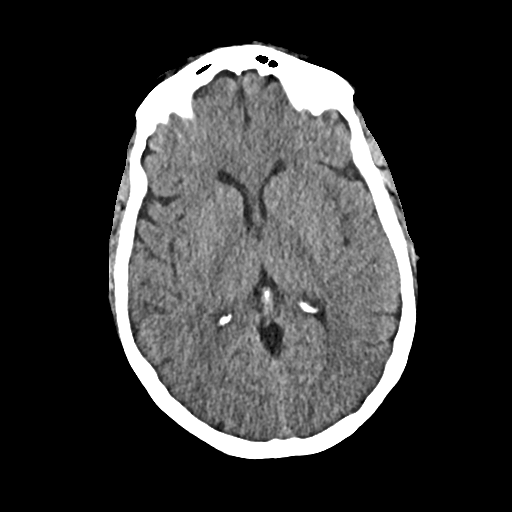
[im 18/35  bone]
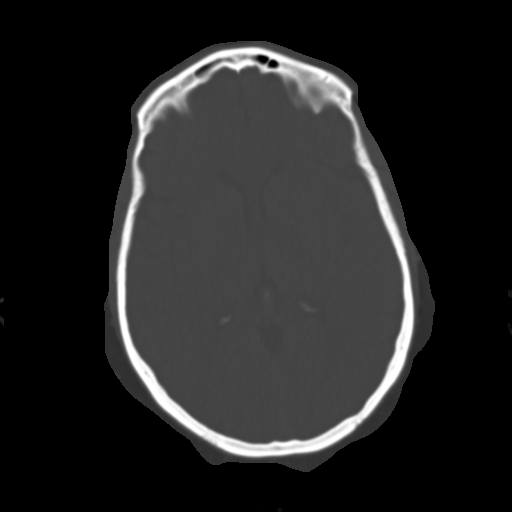
[im 20/35  brain]
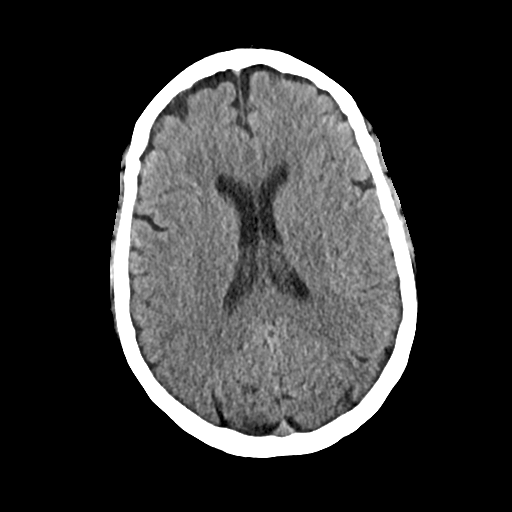
[im 25/35  brain]
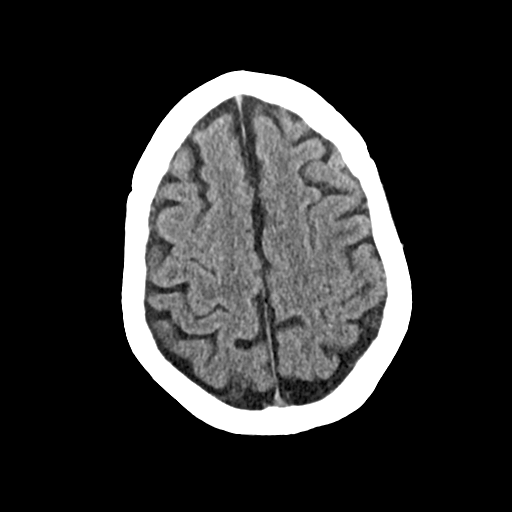
[im 27/35  brain]
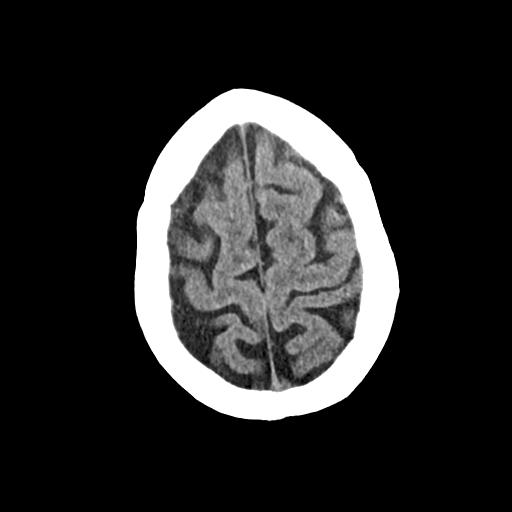
[im 32/35  brain]
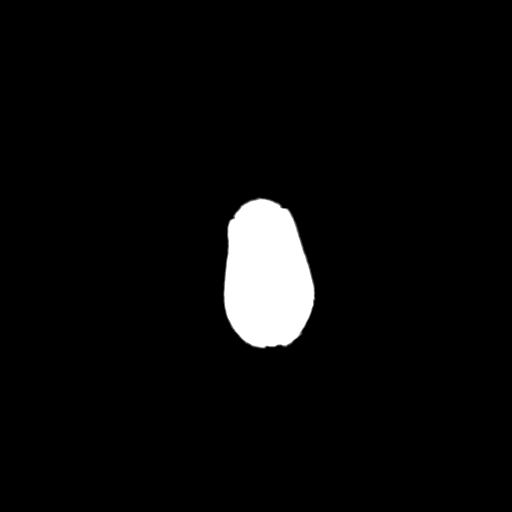
[im 32/35  bone]
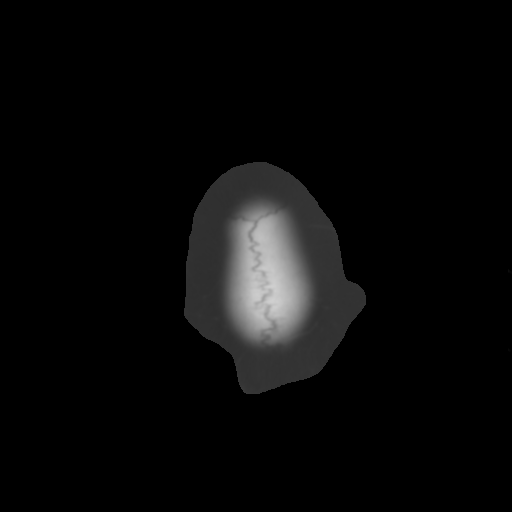

[Series 4: head 3.00 hr40 s3 sag · sagittal · 0.34mm/px · 3 of 60 slices shown]
[im 20/60  brain]
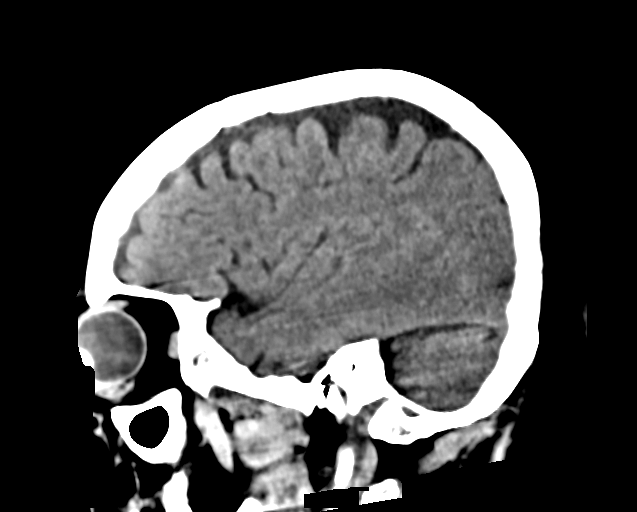
[im 30/60  brain]
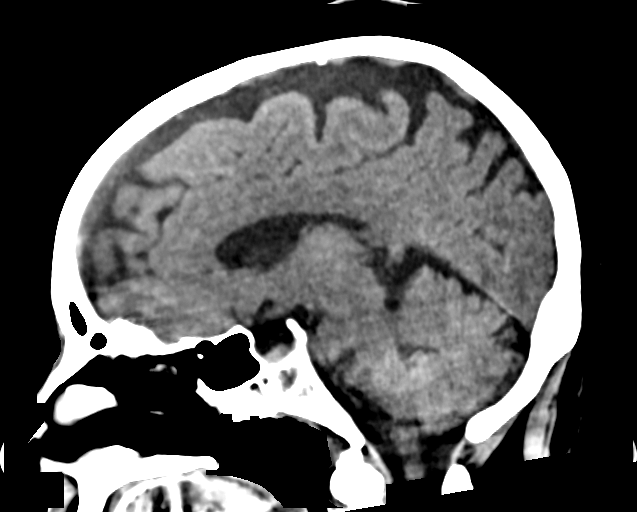
[im 40/60  brain]
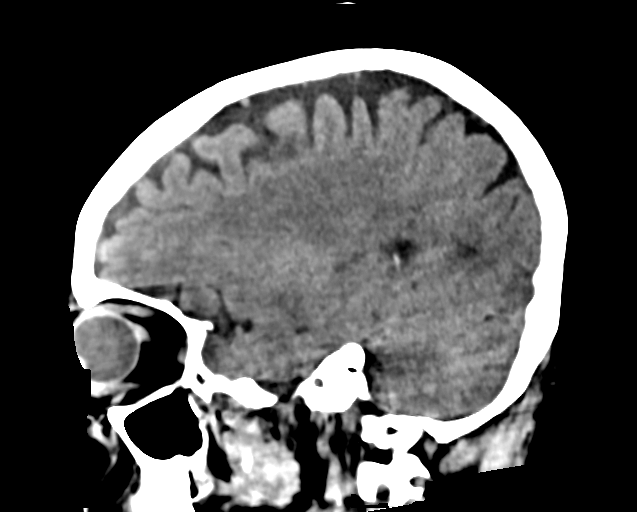

[Series 6: head 3.00 hr40 s3 cor · coronal · 0.34mm/px · 3 of 72 slices shown]
[im 24/72  brain]
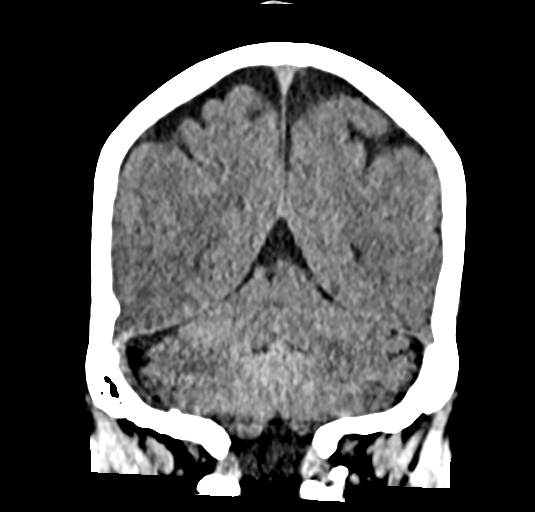
[im 32/72  brain]
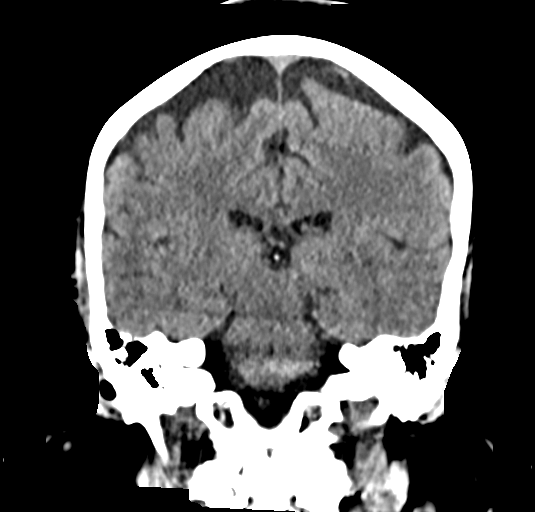
[im 40/72  brain]
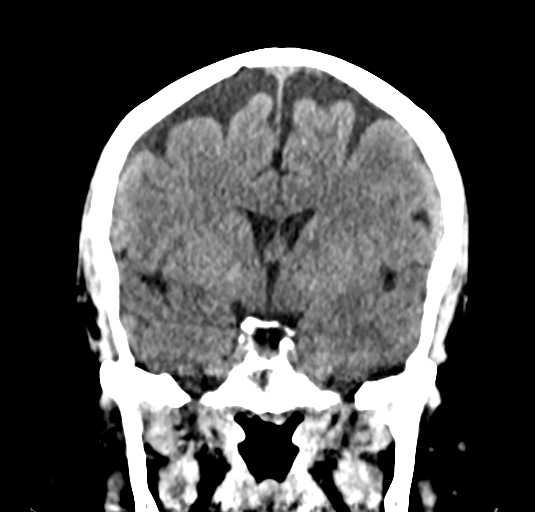

[15 of 47 positions shown; findings below may reference images not displayed]

FINDINGS: Brain: The ventricles are normal in size and configuration. There is
mild to moderate parietal lobe atrophy. There is no mass,
hemorrhage, extra-axial fluid collection, or midline shift.
Decreased attenuation is noted in portions of both occipital lobes
in a symmetric distribution. Elsewhere gray-white compartments
appear normal.

Vascular: No hyperdense vessel evident. There is calcification in
each carotid siphon region.

Skull: The bony calvarium appears intact.

Sinuses/Orbits: There is mucosal thickening in several ethmoid air
cells. Other paranasal sinuses are clear. Orbits appear symmetric
bilaterally.

Other: Mastoid air cells are clear.
IMPRESSION: 1. Symmetric decreased attenuation is noted in each posterior
occipital lobe. This appearance raises question of a degree of
potential posterior reversible encephalopathy/PRES. There is not
felt to be acute infarct evident on this study. Elsewhere gray-white
compartments appear normal. No mass or hemorrhage.

2. There is a degree of symmetric parietal lobe atrophy bilaterally.
Ventricles are normal in size and configuration.

3.  There are foci of arterial vascular calcification.

4.  Mucosal thickening is noted in several ethmoid air cells.

## 2020-04-26 NOTE — Progress Notes (Signed)
Subjective:  Patient ID: Sarah House, female    DOB: 1956-04-04,  MRN: [id number]  64 y.o. female presents with preventative diabetic foot care and painful thick toenails that are difficult to trim. Pain interferes with ambulation. Aggravating factors include wearing enclosed shoe gear. Pain is relieved with periodic professional debridement.   She relates she feels she stepped on something in her garden. Feels as if she's walking on something near her right heel. Pain occurs with weightbearing only. Denies any redness or drainage from right foot.  Review of Systems: Negative except as noted in the HPI. Past Medical History:  Diagnosis Date  . Anemia   . Arthritis    knees  . Diabetes mellitus without complication ([place])   . GERD (gastroesophageal reflux disease)   . Hypercholesterolemia   . Hypertension   . Hyperthyroidism    had "radiation to kill it"   Past Surgical History:  Procedure Laterality Date  . BREAST LUMPECTOMY WITH RADIOACTIVE SEED LOCALIZATION Right 12/23/2015   Procedure: RIGHT BREAST LUMPECTOMY WITH RADIOACTIVE SEED LOCALIZATION;  Surgeon: Coralie Keens, MD;  Location: [place];  Service: General;  Laterality: Right;  . COLONOSCOPY W/ POLYPECTOMY    . COLONOSCOPY WITH PROPOFOL N/A 04/09/2016   Procedure: COLONOSCOPY WITH PROPOFOL;  Surgeon: Carol Ada, MD;  [facility];  Service: Endoscopy;  Laterality: N/A;  . ESOPHAGOGASTRODUODENOSCOPY (EGD) WITH PROPOFOL N/A 04/09/2016   Procedure: ESOPHAGOGASTRODUODENOSCOPY (EGD) WITH PROPOFOL;  Surgeon: Carol Ada, MD;  [facility];  Service: Endoscopy;  Laterality: N/A;  . GIVENS CAPSULE STUDY N/A 06/21/2016   Procedure: GIVENS CAPSULE STUDY;  Surgeon: Juanita Craver, MD;  Location: [place];  Service: Endoscopy;  Laterality: N/A;  . KNEE ARTHROSCOPY     Patient Active Problem List   Diagnosis Date Noted  . Obstructive sleep apnea treated with continuous positive airway  pressure (CPAP) 01/04/2019  . Uncontrolled type 2 diabetes mellitus with hyperglycemia ([place]) 03/24/2018    Current Outpatient Medications:  .  amLODipine (NORVASC) 5 MG tablet, Take 5 mg by mouth daily., Disp: , Rfl:  .  aspirin EC 81 MG tablet, Take 81 mg by mouth daily., Disp: , Rfl:  .  atorvastatin (LIPITOR) 20 MG tablet, Take 20 mg by mouth daily., Disp: , Rfl:  .  B-D ULTRAFINE III SHORT PEN 31G X 8 MM MISC, , Disp: , Rfl:  .  Calcium Carbonate-Vitamin D (CALCIUM 500 + D PO), Take 1 tablet by mouth daily., Disp: , Rfl:  .  cyclobenzaprine (FLEXERIL) 10 MG tablet, Take 10 mg by mouth 3 (three) times daily as needed for muscle spasms. , Disp: , Rfl:  .  glimepiride (AMARYL) 4 MG tablet, Take 8 mg by mouth daily with breakfast., Disp: , Rfl:  .  hydrochlorothiazide (HYDRODIURIL) 25 MG tablet, , Disp: , Rfl:  .  Insulin Degludec (TRESIBA FLEXTOUCH) 200 UNIT/ML SOPN, Inject 36 Units into the skin at bedtime. , Disp: , Rfl:  .  insulin lispro (HUMALOG KWIKPEN) 100 UNIT/ML KiwkPen, Inject 6 Units into the skin. , Disp: , Rfl:  .  losartan-hydrochlorothiazide (HYZAAR) 100-25 MG per tablet, Take 1 tablet by mouth daily., Disp: , Rfl:  .  meloxicam (MOBIC) 15 MG tablet, Take 15 mg by mouth daily as needed for pain. , Disp: , Rfl:  .  metFORMIN (GLUCOPHAGE) 500 MG tablet, Take 1,000 mg by mouth 2 (two) times daily with a meal. , Disp: , Rfl:  .  metFORMIN (GLUCOPHAGE-XR) 500 MG 24 hr tablet, ,  Disp: , Rfl:  .  METROGEL 1 % gel, Apply 1 application topically daily as needed (Apply to affected area.). , Disp: , Rfl:  .  Multiple Vitamin (MULTIVITAMIN WITH MINERALS) TABS tablet, Take 1 tablet by mouth daily., Disp: , Rfl:  .  olmesartan (BENICAR) 40 MG tablet, , Disp: , Rfl:  .  omeprazole (PRILOSEC) 10 MG capsule, Take 10 mg by mouth daily., Disp: , Rfl:  .  tiZANidine (ZANAFLEX) 4 MG tablet, Take 4 mg by mouth as needed., Disp: , Rfl: 0 No Known Allergies Social History   Tobacco Use  Smoking  Status Never Smoker  Smokeless Tobacco Never Used   Objective:  There were no vitals filed for this visit. Constitutional Patient is a pleasant 64 y.o. African American female, in NAD.Marland Kitchen  Vascular Capillary fill time to digits <3 seconds b/l lower extremities. Faintly palpable pedal pulses b/l. Pedal hair absent. Lower extremity skin temperature gradient within normal limits. Nonpitting edema noted b/l lower extremities. Capillary refill normal to all digits.  No cyanosis or clubbing noted.  Neurologic Normal speech. Oriented to person, place, and time. Protective sensation intact 5/5 intact bilaterally with 10g monofilament b/l. Vibratory sensation decreased b/l.  Dermatologic Pedal skin with normal turgor, texture and tone bilaterally. No open wounds bilaterally. No interdigital macerations bilaterally. Toenails 1-5 b/l elongated, discolored, dystrophic, thickened, crumbly with subungual debris and tenderness to dorsal palpation. Porokeratotic lesion(s) plantar aspect of right heel . No erythema, no edema, no drainage, no flocculence.  Orthopedic: Normal muscle strength 5/5 to all lower extremity muscle groups bilaterally. No pain crepitus or joint limitation noted with ROM b/l. Pes planus deformity noted b/l.    Radiographs: None Assessment:   1. Pain due to onychomycosis of toenails of both feet   2. Porokeratosis   3. Diabetic peripheral neuropathy associated with type 2 diabetes mellitus ([place])    Plan:  Patient was evaluated and treated and all questions answered.  Onychomycosis with pain -Nails palliatively debridement as below. -Educated on self-care  Procedure: Nail Debridement Rationale: Pain Type of Debridement: manual, sharp debridement. Instrumentation: Nail nipper, rotary burr. Number of Nails: 10 -Toenails 1-5 b/l were debrided in length and girth with sterile nail nippers and dremel without iatrogenic bleeding.  -Painful porokeratotic lesion(s) plantar aspect of  right heel  pared and enucleated with sterile scalpel blade without incident. -Patient to continue soft, supportive shoe gear daily. -Patient/POA to call should there be question/concern in the interim.  Return in about 3 months (around 07/23/2020) for diabetic nail and callus trim.  Marzetta Board, DPM

## 2020-05-08 DIAGNOSIS — G4733 Obstructive sleep apnea (adult) (pediatric): Secondary | ICD-10-CM | POA: Diagnosis not present

## 2020-05-19 DIAGNOSIS — Z1231 Encounter for screening mammogram for malignant neoplasm of breast: Secondary | ICD-10-CM | POA: Diagnosis not present

## 2020-06-02 ENCOUNTER — Telehealth: Payer: Medicare Other | Admitting: Adult Health

## 2020-06-02 ENCOUNTER — Telehealth: Payer: Self-pay | Admitting: Adult Health

## 2020-06-02 NOTE — Telephone Encounter (Signed)
Pt called to explain she was on MyChart but no one answered. Messaged Nurse and NP. Attempted to reschedule MyChart appt but Epic would not allow  Rescheduling of that appt.  Pt would like a call from the nurse.

## 2020-06-03 NOTE — Telephone Encounter (Signed)
I called pt and looks like she and NP crossed paths did not connect.  Remade appt for 06-17-20 at 1300 55min annual cpap RV.  Icon there, she may call help desk,  Will try again. Pt verbalized understanding.

## 2020-06-15 ENCOUNTER — Encounter: Payer: Self-pay | Admitting: Neurology

## 2020-06-17 ENCOUNTER — Telehealth (INDEPENDENT_AMBULATORY_CARE_PROVIDER_SITE_OTHER): Payer: Medicare Other | Admitting: Adult Health

## 2020-06-17 DIAGNOSIS — Z9989 Dependence on other enabling machines and devices: Secondary | ICD-10-CM | POA: Diagnosis not present

## 2020-06-17 DIAGNOSIS — G4733 Obstructive sleep apnea (adult) (pediatric): Secondary | ICD-10-CM | POA: Diagnosis not present

## 2020-06-17 NOTE — Progress Notes (Addendum)
°  [facility] [address] [place]. [place] [address] [phone]     Virtual Visit via Telephone Note  I connected with Sarah House on 06/17/20 at  1:00 PM EDT by telephone located remotely at [facility] and verified that I am speaking with the correct person using two identifiers who reports being located at home   Visit scheduled by RN She discussed the limitations, risks, security and privacy concerns of performing an evaluation and management service by telephone and the availability of in person appointments. I also discussed with the patient that there may be a patient responsible charge related to this service. The patient expressed understanding and agreed to proceed. See telephone note for consent and additional scheduling information.    History of Present Illness:  Sarah House is a 64 y.o. female who has been followed in this office for obstructive sleep apnea on CPAP.  She was initially scheduled for face-to-face office follow up visit today time but due to [place], visit rescheduled for non-face-to-face telephone visit with patients consent. Unable to participate in video visit due to lack of access to device with camera.    [name] is a 64 year old female with a history of obstructive sleep apnea on CPAP.  Her download indicates that she use her machine nightly for compliance of 100%.  She used her machine greater than 4 hours each night.  On average she uses her machine 8 hours and 41 minutes.  Her residual AHI is 4.3 on 7 to 13 cm of water with EPR of 3.  Her leak in the 95th percentile is 2.6 L/min.  She reports that the CPAP is working well for her.  She can tell a difference in how she feels during the day.   Observations/Objective:  Generalized: Well developed, in no acute distress   Neurological examination  Mentation: Alert oriented to time, place, history taking. Follows all commands speech and language  fluent  Assessment and Plan:  1: Obstructive sleep apnea on CPAP  -Continue using CPAP nightly and greater than 4 hours each night. -CPAP download indicates good compliance and treatment of apnea -Follow-up in 1 year or sooner if needed  Follow Up Instructions:   F/U in 1 year    I discussed the assessment and treatment plan with the patient.  The patient was provided an opportunity to ask questions and all were answered to their satisfaction. The patient agreed with the plan and verbalized an understanding of the instructions.   I provided 20 minutes of non-face-to-face time during this encounter.    Ward Givens NP-C  [facility] [address] [place], [place] [address]  Phone [phone] Fax [phone]  I reviewed the above note and documentation by the Nurse Practitioner and agree with the history, exam, assessment and plan as outlined above. I was available for consultation. Star Age, MD, PhD [facility])

## 2020-07-14 DIAGNOSIS — Z23 Encounter for immunization: Secondary | ICD-10-CM | POA: Diagnosis not present

## 2020-07-15 DIAGNOSIS — G4733 Obstructive sleep apnea (adult) (pediatric): Secondary | ICD-10-CM | POA: Diagnosis not present

## 2020-07-17 DIAGNOSIS — E782 Mixed hyperlipidemia: Secondary | ICD-10-CM | POA: Diagnosis not present

## 2020-07-17 DIAGNOSIS — D500 Iron deficiency anemia secondary to blood loss (chronic): Secondary | ICD-10-CM | POA: Diagnosis not present

## 2020-07-17 DIAGNOSIS — E1065 Type 1 diabetes mellitus with hyperglycemia: Secondary | ICD-10-CM | POA: Diagnosis not present

## 2020-07-17 DIAGNOSIS — I10 Essential (primary) hypertension: Secondary | ICD-10-CM | POA: Diagnosis not present

## 2020-07-24 DIAGNOSIS — E1065 Type 1 diabetes mellitus with hyperglycemia: Secondary | ICD-10-CM | POA: Diagnosis not present

## 2020-07-24 DIAGNOSIS — I10 Essential (primary) hypertension: Secondary | ICD-10-CM | POA: Diagnosis not present

## 2020-07-24 DIAGNOSIS — E782 Mixed hyperlipidemia: Secondary | ICD-10-CM | POA: Diagnosis not present

## 2020-07-30 ENCOUNTER — Ambulatory Visit (INDEPENDENT_AMBULATORY_CARE_PROVIDER_SITE_OTHER): Payer: Medicare Other | Admitting: Podiatry

## 2020-07-30 ENCOUNTER — Encounter: Payer: Self-pay | Admitting: Podiatry

## 2020-07-30 ENCOUNTER — Other Ambulatory Visit: Payer: Self-pay

## 2020-07-30 DIAGNOSIS — M2042 Other hammer toe(s) (acquired), left foot: Secondary | ICD-10-CM

## 2020-07-30 DIAGNOSIS — M2041 Other hammer toe(s) (acquired), right foot: Secondary | ICD-10-CM

## 2020-07-30 DIAGNOSIS — M79674 Pain in right toe(s): Secondary | ICD-10-CM

## 2020-07-30 DIAGNOSIS — M79675 Pain in left toe(s): Secondary | ICD-10-CM | POA: Diagnosis not present

## 2020-07-30 DIAGNOSIS — B351 Tinea unguium: Secondary | ICD-10-CM | POA: Diagnosis not present

## 2020-07-30 DIAGNOSIS — M2141 Flat foot [pes planus] (acquired), right foot: Secondary | ICD-10-CM

## 2020-07-30 DIAGNOSIS — M2142 Flat foot [pes planus] (acquired), left foot: Secondary | ICD-10-CM

## 2020-07-30 DIAGNOSIS — E119 Type 2 diabetes mellitus without complications: Secondary | ICD-10-CM

## 2020-08-03 NOTE — Progress Notes (Signed)
Subjective:  Patient ID: Sarah House, female    DOB: Sep 28, 1956,  MRN: [id number]  64 y.o. female presents with preventative diabetic foot care and painful porokeratotic lesion(s) right heel and painful mycotic toenails that limit ambulation. Painful toenails interfere with ambulation. Aggravating factors include wearing enclosed shoe gear. Pain is relieved with periodic professional debridement. Painful porokeratotic lesions are aggravated when weightbearing with and without shoegear. Pain is relieved with periodic professional debridement.   She states her blood sugar was 114 mg/dl this morning.   She is requesting diabetic shoes today. Dr. Ashby Dawes is her PCP and last visit was 04/16/2020.  Review of Systems: Negative except as noted in the HPI. Past Medical History:  Diagnosis Date  . Anemia   . Arthritis    knees  . Diabetes mellitus without complication ([place])   . GERD (gastroesophageal reflux disease)   . Hypercholesterolemia   . Hypertension   . Hyperthyroidism    had "radiation to kill it"   Past Surgical History:  Procedure Laterality Date  . BREAST LUMPECTOMY WITH RADIOACTIVE SEED LOCALIZATION Right 12/23/2015   Procedure: RIGHT BREAST LUMPECTOMY WITH RADIOACTIVE SEED LOCALIZATION;  Surgeon: Coralie Keens, MD;  Location: [place];  Service: General;  Laterality: Right;  . COLONOSCOPY W/ POLYPECTOMY    . COLONOSCOPY WITH PROPOFOL N/A 04/09/2016   Procedure: COLONOSCOPY WITH PROPOFOL;  Surgeon: Carol Ada, MD;  [facility];  Service: Endoscopy;  Laterality: N/A;  . ESOPHAGOGASTRODUODENOSCOPY (EGD) WITH PROPOFOL N/A 04/09/2016   Procedure: ESOPHAGOGASTRODUODENOSCOPY (EGD) WITH PROPOFOL;  Surgeon: Carol Ada, MD;  [facility];  Service: Endoscopy;  Laterality: N/A;  . GIVENS CAPSULE STUDY N/A 06/21/2016   Procedure: GIVENS CAPSULE STUDY;  Surgeon: Juanita Craver, MD;  Location: [place];  Service: Endoscopy;  Laterality: N/A;   . KNEE ARTHROSCOPY     Patient Active Problem List   Diagnosis Date Noted  . Obstructive sleep apnea treated with continuous positive airway pressure (CPAP) 01/04/2019  . Uncontrolled type 2 diabetes mellitus with hyperglycemia ([place]) 03/24/2018    Current Outpatient Medications:  .  ACCU-CHEK GUIDE test strip, TAKE AS DIRECTED TWICE A DAY, Disp: , Rfl:  .  amLODipine (NORVASC) 5 MG tablet, Take 5 mg by mouth daily., Disp: , Rfl:  .  aspirin EC 81 MG tablet, Take 81 mg by mouth daily., Disp: , Rfl:  .  atorvastatin (LIPITOR) 20 MG tablet, Take 20 mg by mouth daily., Disp: , Rfl:  .  B-D ULTRAFINE III SHORT PEN 31G X 8 MM MISC, , Disp: , Rfl:  .  Blood Glucose Monitoring Suppl (ACCU-CHEK GUIDE) w/Device KIT, See admin instructions., Disp: , Rfl:  .  Calcium Carbonate-Vitamin D (CALCIUM 500 + D PO), Take 1 tablet by mouth daily., Disp: , Rfl:  .  cyclobenzaprine (FLEXERIL) 10 MG tablet, Take 10 mg by mouth 3 (three) times daily as needed for muscle spasms. , Disp: , Rfl:  .  glimepiride (AMARYL) 4 MG tablet, Take 8 mg by mouth daily with breakfast., Disp: , Rfl:  .  hydrochlorothiazide (HYDRODIURIL) 25 MG tablet, , Disp: , Rfl:  .  Insulin Degludec (TRESIBA FLEXTOUCH) 200 UNIT/ML SOPN, Inject 36 Units into the skin at bedtime. , Disp: , Rfl:  .  insulin lispro (HUMALOG KWIKPEN) 100 UNIT/ML KiwkPen, Inject 6 Units into the skin. , Disp: , Rfl:  .  insulin lispro (HUMALOG) 100 UNIT/ML KwikPen, SMARTSIG:6 Unit(s) SUB-Q Daily, Disp: , Rfl:  .  losartan-hydrochlorothiazide (HYZAAR) 100-25 MG per tablet,  Take 1 tablet by mouth daily., Disp: , Rfl:  .  meloxicam (MOBIC) 15 MG tablet, Take 15 mg by mouth daily as needed for pain. , Disp: , Rfl:  .  metFORMIN (GLUCOPHAGE) 500 MG tablet, Take 1,000 mg by mouth 2 (two) times daily with a meal. , Disp: , Rfl:  .  metFORMIN (GLUCOPHAGE-XR) 500 MG 24 hr tablet, , Disp: , Rfl:  .  METROGEL 1 % gel, Apply 1 application topically daily as needed (Apply to  affected area.). , Disp: , Rfl:  .  Multiple Vitamin (MULTIVITAMIN WITH MINERALS) TABS tablet, Take 1 tablet by mouth daily., Disp: , Rfl:  .  nystatin ointment (MYCOSTATIN), Apply topically., Disp: , Rfl:  .  olmesartan (BENICAR) 40 MG tablet, , Disp: , Rfl:  .  omeprazole (PRILOSEC) 10 MG capsule, Take 10 mg by mouth daily., Disp: , Rfl:  .  tiZANidine (ZANAFLEX) 4 MG tablet, Take 4 mg by mouth as needed., Disp: , Rfl: 0 .  TRELEGY ELLIPTA 100-62.5-25 MCG/INH AEPB, Inhale 1 puff into the lungs daily., Disp: , Rfl:  No Known Allergies Social History   Tobacco Use  Smoking Status Never Smoker  Smokeless Tobacco Never Used   Objective:  There were no vitals filed for this visit. Constitutional Patient is a pleasant 64 y.o. African American female, in NAD.Marland Kitchen  Vascular Capillary fill time to digits <3 seconds b/l lower extremities. Faintly palpable pedal pulses b/l. Pedal hair absent. Lower extremity skin temperature gradient within normal limits. Nonpitting edema noted b/l lower extremities. No cyanosis or clubbing noted.  Neurologic Normal speech. Oriented to person, place, and time. Protective sensation intact 5/5 intact bilaterally with 10g monofilament b/l. Vibratory sensation decreased b/l.  Dermatologic Pedal skin with normal turgor, texture and tone bilaterally. No open wounds bilaterally. No interdigital macerations bilaterally. Toenails 1-5 b/l elongated, discolored, dystrophic, thickened, crumbly with subungual debris and tenderness to dorsal palpation. Porokeratotic lesion(s) plantar aspect of right heel resolved . No erythema, no edema, no drainage, no flocculence.  Orthopedic: Normal muscle strength 5/5 to all lower extremity muscle groups bilaterally. No pain crepitus or joint limitation noted with ROM b/l. Hammertoes noted to the 2nd digit.  Pes planus deformity noted b/l.    Radiographs: None Assessment:   1. Pain due to onychomycosis of toenails of both feet   2. Pes planus  of both feet   3. Acquired hammertoes of both feet   4. Controlled type 2 diabetes mellitus without complication, without long-term current use of insulin ([place])    Plan:  Patient was evaluated and treated and all questions answered.  Onychomycosis with pain -Nails palliatively debridement as below. -Educated on self-care  Procedure: Nail Debridement Rationale: Pain Type of Debridement: manual, sharp debridement. Instrumentation: Nail nipper, rotary burr. Number of Nails: 10 -Continue diabeti foot care principles. -Toenails 1-5 b/l were debrided in length and girth with sterile nail nippers and dremel without iatrogenic bleeding.  -Schedule for diabetic shoes with Pedorthist.  -Patient to continue soft, supportive shoe gear daily. -Patient/POA to call should there be question/concern in the interim.  Return in about 3 months (around 10/30/2020).  Marzetta Board, DPM

## 2020-08-13 DIAGNOSIS — G4733 Obstructive sleep apnea (adult) (pediatric): Secondary | ICD-10-CM | POA: Diagnosis not present

## 2020-08-19 ENCOUNTER — Other Ambulatory Visit: Payer: Self-pay

## 2020-08-19 ENCOUNTER — Ambulatory Visit: Payer: Medicare Other | Admitting: Orthotics

## 2020-08-19 DIAGNOSIS — M2141 Flat foot [pes planus] (acquired), right foot: Secondary | ICD-10-CM

## 2020-08-19 DIAGNOSIS — M2041 Other hammer toe(s) (acquired), right foot: Secondary | ICD-10-CM

## 2020-08-19 DIAGNOSIS — M2042 Other hammer toe(s) (acquired), left foot: Secondary | ICD-10-CM

## 2020-08-19 DIAGNOSIS — M2142 Flat foot [pes planus] (acquired), left foot: Secondary | ICD-10-CM

## 2020-08-19 DIAGNOSIS — B351 Tinea unguium: Secondary | ICD-10-CM

## 2020-08-19 DIAGNOSIS — M79674 Pain in right toe(s): Secondary | ICD-10-CM

## 2020-08-19 NOTE — Progress Notes (Signed)

## 2020-09-03 DIAGNOSIS — I10 Essential (primary) hypertension: Secondary | ICD-10-CM | POA: Diagnosis not present

## 2020-09-03 DIAGNOSIS — E1065 Type 1 diabetes mellitus with hyperglycemia: Secondary | ICD-10-CM | POA: Diagnosis not present

## 2020-09-03 DIAGNOSIS — E782 Mixed hyperlipidemia: Secondary | ICD-10-CM | POA: Diagnosis not present

## 2020-10-03 ENCOUNTER — Ambulatory Visit: Payer: Medicare Other | Admitting: Orthotics

## 2020-10-03 ENCOUNTER — Other Ambulatory Visit: Payer: Self-pay

## 2020-10-03 DIAGNOSIS — M2041 Other hammer toe(s) (acquired), right foot: Secondary | ICD-10-CM | POA: Diagnosis not present

## 2020-10-03 DIAGNOSIS — E1142 Type 2 diabetes mellitus with diabetic polyneuropathy: Secondary | ICD-10-CM

## 2020-10-03 DIAGNOSIS — M2141 Flat foot [pes planus] (acquired), right foot: Secondary | ICD-10-CM | POA: Diagnosis not present

## 2020-10-03 DIAGNOSIS — E119 Type 2 diabetes mellitus without complications: Secondary | ICD-10-CM | POA: Diagnosis not present

## 2020-10-03 DIAGNOSIS — M2042 Other hammer toe(s) (acquired), left foot: Secondary | ICD-10-CM | POA: Diagnosis not present

## 2020-10-03 DIAGNOSIS — M2142 Flat foot [pes planus] (acquired), left foot: Secondary | ICD-10-CM | POA: Diagnosis not present

## 2020-11-05 ENCOUNTER — Other Ambulatory Visit: Payer: Self-pay

## 2020-11-05 ENCOUNTER — Encounter: Payer: Self-pay | Admitting: Podiatry

## 2020-11-05 ENCOUNTER — Ambulatory Visit (INDEPENDENT_AMBULATORY_CARE_PROVIDER_SITE_OTHER): Payer: Medicare Other | Admitting: Podiatry

## 2020-11-05 DIAGNOSIS — M79674 Pain in right toe(s): Secondary | ICD-10-CM | POA: Diagnosis not present

## 2020-11-05 DIAGNOSIS — B351 Tinea unguium: Secondary | ICD-10-CM | POA: Diagnosis not present

## 2020-11-05 DIAGNOSIS — M2142 Flat foot [pes planus] (acquired), left foot: Secondary | ICD-10-CM

## 2020-11-05 DIAGNOSIS — M79675 Pain in left toe(s): Secondary | ICD-10-CM

## 2020-11-05 DIAGNOSIS — M2041 Other hammer toe(s) (acquired), right foot: Secondary | ICD-10-CM

## 2020-11-05 DIAGNOSIS — M2141 Flat foot [pes planus] (acquired), right foot: Secondary | ICD-10-CM

## 2020-11-05 DIAGNOSIS — M2042 Other hammer toe(s) (acquired), left foot: Secondary | ICD-10-CM

## 2020-11-05 DIAGNOSIS — E119 Type 2 diabetes mellitus without complications: Secondary | ICD-10-CM

## 2020-11-06 DIAGNOSIS — E1065 Type 1 diabetes mellitus with hyperglycemia: Secondary | ICD-10-CM | POA: Diagnosis not present

## 2020-11-06 DIAGNOSIS — I10 Essential (primary) hypertension: Secondary | ICD-10-CM | POA: Diagnosis not present

## 2020-11-06 DIAGNOSIS — E782 Mixed hyperlipidemia: Secondary | ICD-10-CM | POA: Diagnosis not present

## 2020-11-09 NOTE — Progress Notes (Signed)
Subjective:  Patient ID: Sarah House, female    DOB: [birth year],  MRN: [id number]  65 y.o. female presents with preventative diabetic foot care and painful porokeratotic lesion(s) right heel and painful mycotic toenails that limit ambulation. Painful toenails interfere with ambulation. Aggravating factors include wearing enclosed shoe gear. Pain is relieved with periodic professional debridement. Painful porokeratotic lesions are aggravated when weightbearing with and without shoegear. Pain is relieved with periodic professional debridement.   She states her blood sugar was 137 mg/dl this morning.    Dr. Ashby Dawes is her PCP and last visit was 09/03/2020.  Review of Systems: Negative except as noted in the HPI. Past Medical History:  Diagnosis Date  . Anemia   . Arthritis    knees  . Diabetes mellitus without complication ([place])   . GERD (gastroesophageal reflux disease)   . Hypercholesterolemia   . Hypertension   . Hyperthyroidism    had "radiation to kill it"   Past Surgical History:  Procedure Laterality Date  . BREAST LUMPECTOMY WITH RADIOACTIVE SEED LOCALIZATION Right 12/23/2015   Procedure: RIGHT BREAST LUMPECTOMY WITH RADIOACTIVE SEED LOCALIZATION;  Surgeon: Coralie Keens, MD;  Location: [place];  Service: General;  Laterality: Right;  . COLONOSCOPY W/ POLYPECTOMY    . COLONOSCOPY WITH PROPOFOL N/A 04/09/2016   Procedure: COLONOSCOPY WITH PROPOFOL;  Surgeon: Carol Ada, MD;  [facility];  Service: Endoscopy;  Laterality: N/A;  . ESOPHAGOGASTRODUODENOSCOPY (EGD) WITH PROPOFOL N/A 04/09/2016   Procedure: ESOPHAGOGASTRODUODENOSCOPY (EGD) WITH PROPOFOL;  Surgeon: Carol Ada, MD;  [facility];  Service: Endoscopy;  Laterality: N/A;  . GIVENS CAPSULE STUDY N/A 06/21/2016   Procedure: GIVENS CAPSULE STUDY;  Surgeon: Juanita Craver, MD;  Location: [place];  Service: Endoscopy;  Laterality: N/A;  . KNEE ARTHROSCOPY     Patient  Active Problem List   Diagnosis Date Noted  . Obstructive sleep apnea treated with continuous positive airway pressure (CPAP) 01/04/2019  . Uncontrolled type 2 diabetes mellitus with hyperglycemia ([place]) 03/24/2018    Current Outpatient Medications:  .  clotrimazole-betamethasone (LOTRISONE) cream, 1 application to affected area, Disp: , Rfl:  .  Fluticasone-Umeclidin-Vilant (TRELEGY ELLIPTA IN), 1 puff, Disp: , Rfl:  .  glucose blood test strip, 3 (three) times daily., Disp: , Rfl:  .  glucose blood test strip, See admin instructions., Disp: , Rfl:  .  glucose blood test strip, See admin instructions., Disp: , Rfl:  .  ketoconazole (NIZORAL) 2 % cream, 1 application to affected area, Disp: , Rfl:  .  traMADol (ULTRAM) 50 MG tablet, 1 tablet as needed for pain, Disp: , Rfl:  .  triamcinolone (NASACORT ALLERGY 24HR CHILDREN) 55 MCG/ACT AERO nasal inhaler, 1 spray in each nostril, Disp: , Rfl:  .  ACCU-CHEK GUIDE test strip, TAKE AS DIRECTED TWICE A DAY, Disp: , Rfl:  .  amLODipine (NORVASC) 5 MG tablet, Take 5 mg by mouth daily., Disp: , Rfl:  .  amoxicillin (AMOXIL) 500 MG capsule, Take 500 mg by mouth 3 (three) times daily., Disp: , Rfl:  .  aspirin (EC-81 ASPIRIN) 81 MG EC tablet, 1 tablet, Disp: , Rfl:  .  aspirin EC 81 MG tablet, Take 81 mg by mouth daily., Disp: , Rfl:  .  atorvastatin (LIPITOR) 20 MG tablet, Take 20 mg by mouth daily., Disp: , Rfl:  .  B-D ULTRAFINE III SHORT PEN 31G X 8 MM MISC, , Disp: , Rfl:  .  Blood Glucose Monitoring Suppl (ACCU-CHEK GUIDE) w/Device KIT,  See admin instructions., Disp: , Rfl:  .  Calcium Carbonate-Vitamin D (CALCIUM 500 + D PO), Take 1 tablet by mouth daily., Disp: , Rfl:  .  cetirizine (ZYRTEC ALLERGY) 10 MG tablet, 1 tablet as needed, Disp: , Rfl:  .  cyclobenzaprine (FLEXERIL) 10 MG tablet, Take 10 mg by mouth 3 (three) times daily as needed for muscle spasms. , Disp: , Rfl:  .  glimepiride (AMARYL) 4 MG tablet, Take 8 mg by mouth daily with  breakfast., Disp: , Rfl:  .  hydrochlorothiazide (HYDRODIURIL) 25 MG tablet, , Disp: , Rfl:  .  Insulin Degludec (TRESIBA FLEXTOUCH) 200 UNIT/ML SOPN, Inject 36 Units into the skin at bedtime. , Disp: , Rfl:  .  insulin lispro (HUMALOG KWIKPEN) 100 UNIT/ML KiwkPen, Inject 6 Units into the skin. , Disp: , Rfl:  .  insulin lispro (HUMALOG) 100 UNIT/ML KwikPen, SMARTSIG:6 Unit(s) SUB-Q Daily, Disp: , Rfl:  .  losartan-hydrochlorothiazide (HYZAAR) 100-25 MG per tablet, Take 1 tablet by mouth daily., Disp: , Rfl:  .  meloxicam (MOBIC) 15 MG tablet, Take 15 mg by mouth daily as needed for pain. , Disp: , Rfl:  .  metFORMIN (GLUCOPHAGE) 500 MG tablet, Take 1,000 mg by mouth 2 (two) times daily with a meal. , Disp: , Rfl:  .  metFORMIN (GLUCOPHAGE-XR) 500 MG 24 hr tablet, , Disp: , Rfl:  .  METROGEL 1 % gel, Apply 1 application topically daily as needed (Apply to affected area.). , Disp: , Rfl:  .  Multiple Vitamin (MULTI-VITAMIN DAILY PO), , Disp: , Rfl:  .  Multiple Vitamin (MULTIVITAMIN WITH MINERALS) TABS tablet, Take 1 tablet by mouth daily., Disp: , Rfl:  .  nystatin ointment (MYCOSTATIN), Apply topically., Disp: , Rfl:  .  olmesartan (BENICAR) 40 MG tablet, , Disp: , Rfl:  .  omeprazole (PRILOSEC) 10 MG capsule, Take 10 mg by mouth daily., Disp: , Rfl:  .  tiZANidine (ZANAFLEX) 4 MG tablet, Take 4 mg by mouth as needed., Disp: , Rfl: 0 .  TRELEGY ELLIPTA 100-62.5-25 MCG/INH AEPB, Inhale 1 puff into the lungs daily., Disp: , Rfl:  No Known Allergies Social History   Tobacco Use  Smoking Status Never Smoker  Smokeless Tobacco Never Used   Objective:  There were no vitals filed for this visit. Constitutional Patient is a pleasant 65 y.o. African American female, in NAD.Marland Kitchen  Vascular Capillary fill time to digits <3 seconds b/l lower extremities. Faintly palpable pedal pulses b/l. Pedal hair absent. Lower extremity skin temperature gradient within normal limits. Nonpitting edema noted b/l lower  extremities. No cyanosis or clubbing noted.  Neurologic Normal speech. Oriented to person, place, and time. Protective sensation intact 5/5 intact bilaterally with 10g monofilament b/l. Vibratory sensation decreased b/l.  Dermatologic Pedal skin with normal turgor, texture and tone bilaterally. No open wounds bilaterally. No interdigital macerations bilaterally. Toenails 1-5 b/l elongated, discolored, dystrophic, thickened, crumbly with subungual debris and tenderness to dorsal palpation. Porokeratotic lesion(s) plantar aspect of right heel resolved . No erythema, no edema, no drainage, no flocculence.  Orthopedic: Normal muscle strength 5/5 to all lower extremity muscle groups bilaterally. No pain crepitus or joint limitation noted with ROM b/l. Hammertoes noted to the 2nd digit.  Pes planus deformity noted b/l.    Radiographs: None Assessment:   1. Pain due to onychomycosis of toenails of both feet   2. Pes planus of both feet   3. Acquired hammertoes of both feet   4. Controlled type 2  diabetes mellitus without complication, without long-term current use of insulin ([place])    Plan:  Patient was evaluated and treated and all questions answered.  Onychomycosis with pain -Nails palliatively debridement as below. -Educated on self-care  Procedure: Nail Debridement Rationale: Pain Type of Debridement: manual, sharp debridement. Instrumentation: Nail nipper, rotary burr. Number of Nails: 10 -Continue diabeti foot care principles. -Toenails 1-5 b/l were debrided in length and girth with sterile nail nippers and dremel without iatrogenic bleeding.  -Patient to continue soft, supportive shoe gear daily. -Patient/POA to call should there be question/concern in the interim.  Return in about 3 months (around 02/03/2021).  Marzetta Board, DPM

## 2020-11-12 DIAGNOSIS — G4733 Obstructive sleep apnea (adult) (pediatric): Secondary | ICD-10-CM | POA: Diagnosis not present

## 2020-11-13 DIAGNOSIS — E1065 Type 1 diabetes mellitus with hyperglycemia: Secondary | ICD-10-CM | POA: Diagnosis not present

## 2020-11-13 DIAGNOSIS — R072 Precordial pain: Secondary | ICD-10-CM | POA: Diagnosis not present

## 2020-11-13 DIAGNOSIS — E782 Mixed hyperlipidemia: Secondary | ICD-10-CM | POA: Diagnosis not present

## 2020-11-13 DIAGNOSIS — I10 Essential (primary) hypertension: Secondary | ICD-10-CM | POA: Diagnosis not present

## 2020-11-13 DIAGNOSIS — K900 Celiac disease: Secondary | ICD-10-CM | POA: Diagnosis not present

## 2020-11-18 DIAGNOSIS — I10 Essential (primary) hypertension: Secondary | ICD-10-CM | POA: Diagnosis not present

## 2020-11-18 DIAGNOSIS — R072 Precordial pain: Secondary | ICD-10-CM | POA: Diagnosis not present

## 2020-11-18 DIAGNOSIS — R079 Chest pain, unspecified: Secondary | ICD-10-CM | POA: Diagnosis not present

## 2021-01-30 ENCOUNTER — Other Ambulatory Visit: Payer: Self-pay

## 2021-01-30 ENCOUNTER — Encounter: Payer: Self-pay | Admitting: Podiatry

## 2021-01-30 ENCOUNTER — Ambulatory Visit: Payer: Medicare Other | Admitting: Podiatry

## 2021-01-30 DIAGNOSIS — M79674 Pain in right toe(s): Secondary | ICD-10-CM | POA: Diagnosis not present

## 2021-01-30 DIAGNOSIS — E782 Mixed hyperlipidemia: Secondary | ICD-10-CM | POA: Insufficient documentation

## 2021-01-30 DIAGNOSIS — Z0000 Encounter for general adult medical examination without abnormal findings: Secondary | ICD-10-CM | POA: Insufficient documentation

## 2021-01-30 DIAGNOSIS — K900 Celiac disease: Secondary | ICD-10-CM | POA: Insufficient documentation

## 2021-01-30 DIAGNOSIS — I6783 Posterior reversible encephalopathy syndrome: Secondary | ICD-10-CM | POA: Insufficient documentation

## 2021-01-30 DIAGNOSIS — I10 Essential (primary) hypertension: Secondary | ICD-10-CM | POA: Insufficient documentation

## 2021-01-30 DIAGNOSIS — E1065 Type 1 diabetes mellitus with hyperglycemia: Secondary | ICD-10-CM | POA: Insufficient documentation

## 2021-01-30 DIAGNOSIS — M2141 Flat foot [pes planus] (acquired), right foot: Secondary | ICD-10-CM

## 2021-01-30 DIAGNOSIS — E1142 Type 2 diabetes mellitus with diabetic polyneuropathy: Secondary | ICD-10-CM | POA: Diagnosis not present

## 2021-01-30 DIAGNOSIS — M79675 Pain in left toe(s): Secondary | ICD-10-CM | POA: Diagnosis not present

## 2021-01-30 DIAGNOSIS — M2041 Other hammer toe(s) (acquired), right foot: Secondary | ICD-10-CM

## 2021-01-30 DIAGNOSIS — D500 Iron deficiency anemia secondary to blood loss (chronic): Secondary | ICD-10-CM | POA: Insufficient documentation

## 2021-01-30 DIAGNOSIS — B351 Tinea unguium: Secondary | ICD-10-CM | POA: Diagnosis not present

## 2021-01-30 DIAGNOSIS — M2142 Flat foot [pes planus] (acquired), left foot: Secondary | ICD-10-CM

## 2021-01-30 DIAGNOSIS — E119 Type 2 diabetes mellitus without complications: Secondary | ICD-10-CM | POA: Diagnosis not present

## 2021-01-30 DIAGNOSIS — M170 Bilateral primary osteoarthritis of knee: Secondary | ICD-10-CM | POA: Insufficient documentation

## 2021-01-30 DIAGNOSIS — G43909 Migraine, unspecified, not intractable, without status migrainosus: Secondary | ICD-10-CM | POA: Insufficient documentation

## 2021-01-30 DIAGNOSIS — M2042 Other hammer toe(s) (acquired), left foot: Secondary | ICD-10-CM | POA: Diagnosis not present

## 2021-01-30 DIAGNOSIS — E0590 Thyrotoxicosis, unspecified without thyrotoxic crisis or storm: Secondary | ICD-10-CM | POA: Insufficient documentation

## 2021-01-30 DIAGNOSIS — K219 Gastro-esophageal reflux disease without esophagitis: Secondary | ICD-10-CM | POA: Insufficient documentation

## 2021-01-30 NOTE — Progress Notes (Signed)
ANNUAL DIABETIC FOOT EXAM  Subjective: Sarah House presents today for for annual diabetic foot examination and painful thick toenails that are difficult to trim. Pain interferes with ambulation. Aggravating factors include wearing enclosed shoe gear. Pain is relieved with periodic professional debridement..  Patient relates 5 year h/o diabetes.  Patient denies any h/o foot wounds.  Patient does have occasional symptoms of foot numbness.  Patient denies symptoms of foot tingling.  Patient denies symptoms of burning in feet.  Patient denies symptoms of pins/needles in feet.  Patient's blood sugar was 115 mg/dl this morning.   [name], MD is patient's PCP. Last visit was January, 2022.  Past Medical History:  Diagnosis Date  . Anemia   . Arthritis    knees  . Diabetes mellitus without complication ([place])   . GERD (gastroesophageal reflux disease)   . Hypercholesterolemia   . Hypertension   . Hyperthyroidism    had "radiation to kill it"   Patient Active Problem List   Diagnosis Date Noted  . Arthritis of both knees 01/30/2021  . Celiac disease 01/30/2021  . Encounter for general adult medical examination without abnormal findings 01/30/2021  . Essential hypertension 01/30/2021  . Gastroesophageal reflux disease 01/30/2021  . Iron deficiency anemia due to chronic blood loss 01/30/2021  . Migraine 01/30/2021  . Mixed hyperlipidemia 01/30/2021  . Morbid obesity ([place]) 01/30/2021  . Posterior reversible encephalopathy syndrome 01/30/2021  . Thyrotoxicosis 01/30/2021  . Type 1 diabetes mellitus with hyperglycemia ([place]) 01/30/2021  . Obstructive sleep apnea treated with continuous positive airway pressure (CPAP) 01/04/2019  . Uncontrolled type 2 diabetes mellitus with hyperglycemia ([place]) 03/24/2018   Past Surgical History:  Procedure Laterality Date  . BREAST LUMPECTOMY WITH RADIOACTIVE SEED LOCALIZATION Right 12/23/2015   Procedure: RIGHT BREAST LUMPECTOMY  WITH RADIOACTIVE SEED LOCALIZATION;  Surgeon: Coralie Keens, MD;  Location: [facility];  Service: General;  Laterality: Right;  . COLONOSCOPY W/ POLYPECTOMY    . COLONOSCOPY WITH PROPOFOL N/A 04/09/2016   Procedure: COLONOSCOPY WITH PROPOFOL;  Surgeon: Carol Ada, MD;  [facility];  Service: Endoscopy;  Laterality: N/A;  . ESOPHAGOGASTRODUODENOSCOPY (EGD) WITH PROPOFOL N/A 04/09/2016   Procedure: ESOPHAGOGASTRODUODENOSCOPY (EGD) WITH PROPOFOL;  Surgeon: Carol Ada, MD;  [facility];  Service: Endoscopy;  Laterality: N/A;  . GIVENS CAPSULE STUDY N/A 06/21/2016   Procedure: GIVENS CAPSULE STUDY;  Surgeon: Juanita Craver, MD;  Location: [place];  Service: Endoscopy;  Laterality: N/A;  . KNEE ARTHROSCOPY     Current Outpatient Medications on File Prior to Visit  Medication Sig Dispense Refill  . ketoconazole (NIZORAL) 2 % cream 1 application to affected area    . omeprazole (PRILOSEC) 20 MG capsule 1 capsule    . tiZANidine (ZANAFLEX) 4 MG tablet 1 tablet as needed for muscle spasm    . triamcinolone (NASACORT ALLERGY 24HR CHILDREN) 55 MCG/ACT AERO nasal inhaler 1 spray in each nostril    . ACCU-CHEK GUIDE test strip TAKE AS DIRECTED TWICE A DAY    . amLODipine (NORVASC) 5 MG tablet Take 5 mg by mouth daily.    Marland Kitchen amoxicillin (AMOXIL) 500 MG capsule Take 500 mg by mouth 3 (three) times daily.    Marland Kitchen amoxicillin-clavulanate (AUGMENTIN) 875-125 MG tablet Take 1 tablet by mouth 2 (two) times daily.    Marland Kitchen aspirin (EC-81 ASPIRIN) 81 MG EC tablet 1 tablet    . aspirin EC 81 MG tablet Take 81 mg by mouth daily.    Marland Kitchen atorvastatin (LIPITOR) 20  MG tablet Take 20 mg by mouth daily.    Marland Kitchen azithromycin (ZITHROMAX) 250 MG tablet TAKE 2 TABLETS BY MOUTH ON DAY 1, THEN TAKE 1 TABLET DAILY ON DAYS 2-5    . Blood Glucose Monitoring Suppl (ACCU-CHEK GUIDE) w/Device KIT See admin instructions.    . calcium carbonate (SUPER CALCIUM) 1500 (600 Ca) MG TABS tablet 1 tablet with  meals    . Calcium Carbonate-Vitamin D (CALCIUM 500 + D PO) Take 1 tablet by mouth daily.    . cetirizine (ZYRTEC ALLERGY) 10 MG tablet 1 tablet as needed    . ciprofloxacin (CIPRO) 500 MG tablet Take 500 mg by mouth 2 (two) times daily.    . clotrimazole-betamethasone (LOTRISONE) cream 1 application to affected area    . cyclobenzaprine (FLEXERIL) 10 MG tablet Take 10 mg by mouth 3 (three) times daily as needed for muscle spasms.     Marland Kitchen doxycycline (VIBRA-TABS) 100 MG tablet Take 100 mg by mouth 2 (two) times daily.    . Fluticasone-Umeclidin-Vilant (TRELEGY ELLIPTA IN) 1 puff    . glimepiride (AMARYL) 4 MG tablet Take 8 mg by mouth daily with breakfast.    . glucose blood test strip 3 (three) times daily.    Marland Kitchen glucose blood test strip See admin instructions.    Marland Kitchen glucose blood test strip See admin instructions.    . hydrochlorothiazide (HYDRODIURIL) 25 MG tablet     . Insulin Degludec (TRESIBA FLEXTOUCH) 200 UNIT/ML SOPN Inject 36 Units into the skin at bedtime.     . insulin lispro (HUMALOG KWIKPEN) 100 UNIT/ML KiwkPen Inject 6 Units into the skin.     Marland Kitchen insulin lispro (HUMALOG) 100 UNIT/ML KwikPen SMARTSIG:6 Unit(s) SUB-Q Daily    . Insulin Pen Needle (B-D ULTRAFINE III SHORT PEN) 31G X 8 MM MISC See admin instructions.    Marland Kitchen losartan-hydrochlorothiazide (HYZAAR) 100-25 MG per tablet Take 1 tablet by mouth daily.    . meloxicam (MOBIC) 15 MG tablet Take 15 mg by mouth daily as needed for pain.     . metFORMIN (GLUCOPHAGE-XR) 500 MG 24 hr tablet 2 tablets    . METROGEL 1 % gel Apply 1 application topically daily as needed (Apply to affected area.).     Marland Kitchen metroNIDAZOLE (FLAGYL) 500 MG tablet Take 500 mg by mouth 3 (three) times daily.    . Multiple Vitamin (MULTI-VITAMIN DAILY PO)     . Multiple Vitamin (MULTIVITAMIN WITH MINERALS) TABS tablet Take 1 tablet by mouth daily.    Marland Kitchen nystatin ointment (MYCOSTATIN) Apply topically.    Marland Kitchen olmesartan (BENICAR) 40 MG tablet     . traMADol (ULTRAM) 50  MG tablet 1 tablet as needed for pain    . TRELEGY ELLIPTA 100-62.5-25 MCG/INH AEPB Inhale 1 puff into the lungs daily.     No current facility-administered medications on file prior to visit.    No Known Allergies Social History   Occupational History    Comment: customer service rep [employer]  Tobacco Use  . Smoking status: Never Smoker  . Smokeless tobacco: Never Used  Substance and Sexual Activity  . Alcohol use: No    Alcohol/week: 0.0 standard drinks  . Drug use: No  . Sexual activity: Not on file   Family History  Problem Relation Age of Onset  . Diabetes Mother   . Lymphoma Father   . Leukemia Maternal Uncle   . Cancer Paternal Grandfather        stomach  . Heart disease Sister  Immunization History  Administered Date(s) Administered  . Influenza Inj Mdck Quad Pf 09/19/2017  . Influenza,inj,Quad PF,6+ Mos 07/19/2018     Review of Systems: Negative except as noted in the HPI.  Objective: There were no vitals filed for this visit.  Sarah House is a pleasant 65 y.o. female, morbidly obese, in NAD. AAO X 3.  Vascular Examination: Capillary fill time to digits <3 seconds b/l lower extremities. Faintly palpable pedal pulses b/l. Pedal hair absent. Lower extremity skin temperature gradient within normal limits. No pain with calf compression b/l. Nonpitting edema noted b/l lower extremities.  Dermatological Examination: Pedal skin with normal turgor, texture and tone bilaterally. No open wounds bilaterally. No interdigital macerations bilaterally. Toenails 1-5 b/l elongated, discolored, dystrophic, thickened, crumbly with subungual debris and tenderness to dorsal palpation.  Musculoskeletal Examination: Normal muscle strength 5/5 to all lower extremity muscle groups bilaterally. No pain crepitus or joint limitation noted with ROM b/l. Hammertoes noted to the L 2nd toe and R 2nd toe. Pes planus deformity noted b/l.   Footwear Assessment: Does the patient wear  appropriate shoes? No. Does the patient need inserts/orthotics? Yes.  Neurological Examination: Pt has subjective symptoms of neuropathy. Protective sensation intact 5/5 intact bilaterally with 10g monofilament b/l. Vibratory sensation decreased b/l.  Assessment: 1. Pain due to onychomycosis of toenails of both feet   2. Pes planus of both feet   3. Acquired hammertoes of both feet   4. Diabetic peripheral neuropathy associated with type 2 diabetes mellitus ([place])   5. Encounter for diabetic foot exam ([place])     ADA Risk Categorization: Low Risk :  Patient has all of the following: Intact protective sensation No prior foot ulcer  No severe deformity Pedal pulses present  Plan: -Examined patient. -Diabetic foot examination performed on today's visit. -Continue diabetic foot care principles. -Patient to continue soft, supportive shoe gear daily. Start procedure for diabetic shoes. Patient qualifies based on diagnoses. -Toenails 1-5 b/l were debrided in length and girth with sterile nail nippers and dremel without iatrogenic bleeding.  -Patient to report any pedal injuries to medical professional immediately. -Patient/POA to call should there be question/concern in the interim.  Return in about 3 months (around 05/01/2021).  Marzetta Board, DPM

## 2021-02-04 ENCOUNTER — Telehealth: Payer: Self-pay | Admitting: Podiatry

## 2021-02-04 NOTE — Telephone Encounter (Signed)
LVM for patient to return call to get casting scheduled for diabetic shoes

## 2021-02-05 ENCOUNTER — Telehealth: Payer: Self-pay | Admitting: Podiatry

## 2021-02-05 NOTE — Telephone Encounter (Signed)
Great thank you Doren Custard!

## 2021-02-05 NOTE — Telephone Encounter (Signed)
Spoke with patient regarding casting for diabetic shoes, patient stated she just received shoes back in December 2021 and would like to return in December 2022 for the next pair. To ensure the insurance will cover she would rather wait

## 2021-02-10 DIAGNOSIS — G4733 Obstructive sleep apnea (adult) (pediatric): Secondary | ICD-10-CM | POA: Diagnosis not present

## 2021-02-10 DIAGNOSIS — Z23 Encounter for immunization: Secondary | ICD-10-CM | POA: Diagnosis not present

## 2021-02-10 DIAGNOSIS — Z0000 Encounter for general adult medical examination without abnormal findings: Secondary | ICD-10-CM | POA: Diagnosis not present

## 2021-02-10 DIAGNOSIS — I10 Essential (primary) hypertension: Secondary | ICD-10-CM | POA: Diagnosis not present

## 2021-02-10 DIAGNOSIS — E782 Mixed hyperlipidemia: Secondary | ICD-10-CM | POA: Diagnosis not present

## 2021-02-10 DIAGNOSIS — K900 Celiac disease: Secondary | ICD-10-CM | POA: Diagnosis not present

## 2021-02-10 DIAGNOSIS — E1065 Type 1 diabetes mellitus with hyperglycemia: Secondary | ICD-10-CM | POA: Diagnosis not present

## 2021-02-17 DIAGNOSIS — Z0000 Encounter for general adult medical examination without abnormal findings: Secondary | ICD-10-CM | POA: Diagnosis not present

## 2021-02-17 DIAGNOSIS — E782 Mixed hyperlipidemia: Secondary | ICD-10-CM | POA: Diagnosis not present

## 2021-02-17 DIAGNOSIS — E1065 Type 1 diabetes mellitus with hyperglycemia: Secondary | ICD-10-CM | POA: Diagnosis not present

## 2021-02-17 DIAGNOSIS — I10 Essential (primary) hypertension: Secondary | ICD-10-CM | POA: Diagnosis not present

## 2021-02-17 DIAGNOSIS — K900 Celiac disease: Secondary | ICD-10-CM | POA: Diagnosis not present

## 2021-02-17 DIAGNOSIS — G4733 Obstructive sleep apnea (adult) (pediatric): Secondary | ICD-10-CM | POA: Diagnosis not present

## 2021-02-21 DIAGNOSIS — I10 Essential (primary) hypertension: Secondary | ICD-10-CM | POA: Diagnosis not present

## 2021-02-21 DIAGNOSIS — M170 Bilateral primary osteoarthritis of knee: Secondary | ICD-10-CM | POA: Diagnosis not present

## 2021-02-21 DIAGNOSIS — E782 Mixed hyperlipidemia: Secondary | ICD-10-CM | POA: Diagnosis not present

## 2021-02-21 DIAGNOSIS — E1065 Type 1 diabetes mellitus with hyperglycemia: Secondary | ICD-10-CM | POA: Diagnosis not present

## 2021-03-24 DIAGNOSIS — M170 Bilateral primary osteoarthritis of knee: Secondary | ICD-10-CM | POA: Diagnosis not present

## 2021-03-24 DIAGNOSIS — I10 Essential (primary) hypertension: Secondary | ICD-10-CM | POA: Diagnosis not present

## 2021-03-24 DIAGNOSIS — E1065 Type 1 diabetes mellitus with hyperglycemia: Secondary | ICD-10-CM | POA: Diagnosis not present

## 2021-03-24 DIAGNOSIS — E782 Mixed hyperlipidemia: Secondary | ICD-10-CM | POA: Diagnosis not present

## 2021-04-09 NOTE — Progress Notes (Signed)
Pt received her diabetic shoes with EJ from [facility]. She was advised to give our office a call if any issues arise.

## 2021-05-11 DIAGNOSIS — G4733 Obstructive sleep apnea (adult) (pediatric): Secondary | ICD-10-CM | POA: Diagnosis not present

## 2021-05-15 ENCOUNTER — Encounter: Payer: Self-pay | Admitting: Podiatry

## 2021-05-15 ENCOUNTER — Ambulatory Visit: Payer: Medicare Other | Admitting: Podiatry

## 2021-05-15 ENCOUNTER — Other Ambulatory Visit: Payer: Self-pay

## 2021-05-15 DIAGNOSIS — M79675 Pain in left toe(s): Secondary | ICD-10-CM

## 2021-05-15 DIAGNOSIS — B351 Tinea unguium: Secondary | ICD-10-CM | POA: Diagnosis not present

## 2021-05-15 DIAGNOSIS — M79674 Pain in right toe(s): Secondary | ICD-10-CM | POA: Diagnosis not present

## 2021-05-15 DIAGNOSIS — E1142 Type 2 diabetes mellitus with diabetic polyneuropathy: Secondary | ICD-10-CM | POA: Diagnosis not present

## 2021-05-17 NOTE — Progress Notes (Signed)
Subjective: Sarah House is a pleasant 65 y.o. female patient seen today painful thick toenails that are difficult to trim. Pain interferes with ambulation. Aggravating factors include wearing enclosed shoe gear. Pain is relieved with periodic professional debridement.  PCP is [name], MD. Last visit was: 03/27/2021.  No Known Allergies  Objective: Physical Exam  General: Sarah House is a pleasant 65 y.o. African American female, in NAD. AAO x 3.   Vascular:  Capillary fill time to digits <3 seconds b/l lower extremities. Faintly palpable DP pulse(s) b/l lower extremities. Faintly palpable PT pulse(s) b/l lower extremities. Pedal hair absent. Lower extremity skin temperature gradient within normal limits. No pain with calf compression b/l. Nonpitting edema noted b/l lower extremities.  Dermatological:  Pedal skin with normal turgor, texture and tone b/l lower extremities. No open wounds b/l lower extremities. No interdigital macerations b/l lower extremities. Toenails 1-5 b/l elongated, discolored, dystrophic, thickened, crumbly with subungual debris and tenderness to dorsal palpation.  Musculoskeletal:  Normal muscle strength 5/5 to all lower extremity muscle groups bilaterally. No pain crepitus or joint limitation noted with ROM b/l. Hammertoe(s) noted to the L 2nd toe and R 2nd toe. Pes planus deformity noted b/l.   Neurological:  Pt has subjective symptoms of neuropathy. Protective sensation intact 5/5 intact bilaterally with 10g monofilament b/l. Vibratory sensation decreased b/l.  Assessment and Plan:  1. Pain due to onychomycosis of toenails of both feet   2. Diabetic peripheral neuropathy associated with type 2 diabetes mellitus ([place])     -Examined patient. -Patient to continue soft, supportive shoe gear daily. -Toenails 1-5 b/l were debrided in length and girth with sterile nail nippers and dremel without iatrogenic bleeding.  -Patient to report any pedal  injuries to medical professional immediately. -Patient/POA to call should there be question/concern in the interim.  Return in about 3 months (around 08/15/2021).  Marzetta Board, DPM

## 2021-05-25 DIAGNOSIS — Z1231 Encounter for screening mammogram for malignant neoplasm of breast: Secondary | ICD-10-CM | POA: Diagnosis not present

## 2021-06-18 ENCOUNTER — Encounter: Payer: Self-pay | Admitting: *Deleted

## 2021-06-22 ENCOUNTER — Telehealth: Payer: Medicare Other | Admitting: Adult Health

## 2021-06-22 DIAGNOSIS — G4733 Obstructive sleep apnea (adult) (pediatric): Secondary | ICD-10-CM | POA: Diagnosis not present

## 2021-06-22 DIAGNOSIS — Z9989 Dependence on other enabling machines and devices: Secondary | ICD-10-CM

## 2021-06-22 NOTE — Progress Notes (Signed)
PATIENT: Sarah House DOB: [birth year]  REASON FOR VISIT: follow up HISTORY FROM: patient PRIMARY NEUROLOGIST: Dr. Brett Fairy  Virtual Visit via Video Note  I connected with Sarah House on 06/22/21 at  1:00 PM EDT by a video enabled telemedicine application located remotely at [facility] and verified that I am speaking with the correct person using two identifiers who was located at their own home.   I discussed the limitations of evaluation and management by telemedicine and the availability of in person appointments. The patient expressed understanding and agreed to proceed.   PATIENT: Sarah House DOB: October 24, 1956  REASON FOR VISIT: follow up HISTORY FROM: patient  HISTORY OF PRESENT ILLNESS: Today 06/22/21: Sarah House  is a 65 y.o. female who has been followed in this office for obstructive sleep apnea on CPAP. She reports no issues and it is working well for her. Download is below.    HISTORY Sarah House is a 65 year old female with a history of obstructive sleep apnea on CPAP.  Her download indicates that she use her machine nightly for compliance of 100%.  She used her machine greater than 4 hours each night.  On average she uses her machine 8 hours and 41 minutes.  Her residual AHI is 4.3 on 7 to 13 cm of water with EPR of 3.  Her leak in the 95th percentile is 2.6 L/min.  She reports that the CPAP is working well for her.  She can tell a difference in how she feels during the day.    REVIEW OF SYSTEMS: Out of a complete 14 system review of symptoms, the patient complains only of the following symptoms, and all other reviewed systems are negative.  ALLERGIES: No Known Allergies  HOME MEDICATIONS: Outpatient Medications Prior to Visit  Medication Sig Dispense Refill   ACCU-CHEK GUIDE test strip TAKE AS DIRECTED TWICE A DAY     amLODipine (NORVASC) 5 MG tablet Take 5 mg by mouth daily.     amoxicillin (AMOXIL) 500 MG capsule Take 500 mg by  mouth 3 (three) times daily.     amoxicillin-clavulanate (AUGMENTIN) 875-125 MG tablet Take 1 tablet by mouth 2 (two) times daily.     aspirin (EC-81 ASPIRIN) 81 MG EC tablet 1 tablet     aspirin EC 81 MG tablet Take 81 mg by mouth daily.     atorvastatin (LIPITOR) 20 MG tablet Take 20 mg by mouth daily.     azithromycin (ZITHROMAX) 250 MG tablet TAKE 2 TABLETS BY MOUTH ON DAY 1, THEN TAKE 1 TABLET DAILY ON DAYS 2-5     Blood Glucose Monitoring Suppl (ACCU-CHEK GUIDE) w/Device KIT See admin instructions.     calcium carbonate (SUPER CALCIUM) 1500 (600 Ca) MG TABS tablet 1 tablet with meals     Calcium Carbonate-Vitamin D (CALCIUM 500 + D PO) Take 1 tablet by mouth daily.     cetirizine (ZYRTEC ALLERGY) 10 MG tablet 1 tablet as needed     chlorhexidine (PERIDEX) 0.12 % solution SMARTSIG:15 Milliliter(s) By Mouth 3 Times Daily     ciprofloxacin (CIPRO) 500 MG tablet Take 500 mg by mouth 2 (two) times daily.     clotrimazole-betamethasone (LOTRISONE) cream 1 application to affected area     cyclobenzaprine (FLEXERIL) 10 MG tablet Take 10 mg by mouth 3 (three) times daily as needed for muscle spasms.      doxycycline (VIBRA-TABS) 100 MG tablet Take 100 mg by mouth 2 (two) times daily.  Fluticasone-Umeclidin-Vilant (TRELEGY ELLIPTA IN) 1 puff     glimepiride (AMARYL) 4 MG tablet Take 8 mg by mouth daily with breakfast.     Glucagon (GVOKE HYPOPEN 2-PACK) 1 MG/0.2ML SOAJ See admin instructions.     glucose blood test strip 3 (three) times daily.     glucose blood test strip See admin instructions.     glucose blood test strip See admin instructions.     hydrochlorothiazide (HYDRODIURIL) 25 MG tablet      Insulin Degludec (TRESIBA FLEXTOUCH) 200 UNIT/ML SOPN Inject 36 Units into the skin at bedtime.      insulin lispro (HUMALOG KWIKPEN) 100 UNIT/ML KiwkPen Inject 6 Units into the skin.      insulin lispro (HUMALOG) 100 UNIT/ML KwikPen SMARTSIG:6 Unit(s) SUB-Q Daily     Insulin Pen Needle (B-D  ULTRAFINE III SHORT PEN) 31G X 8 MM MISC See admin instructions.     ketoconazole (NIZORAL) 2 % cream 1 application to affected area     losartan-hydrochlorothiazide (HYZAAR) 100-25 MG per tablet Take 1 tablet by mouth daily.     meloxicam (MOBIC) 15 MG tablet Take 15 mg by mouth daily as needed for pain.      metFORMIN (GLUCOPHAGE-XR) 500 MG 24 hr tablet 2 tablets     metFORMIN (GLUCOPHAGE-XR) 500 MG 24 hr tablet Take 2 tablets by mouth 2 (two) times daily.     METROGEL 1 % gel Apply 1 application topically daily as needed (Apply to affected area.).      metroNIDAZOLE (FLAGYL) 500 MG tablet Take 500 mg by mouth 3 (three) times daily.     Multiple Vitamin (MULTI-VITAMIN DAILY PO)      Multiple Vitamin (MULTIVITAMIN WITH MINERALS) TABS tablet Take 1 tablet by mouth daily.     nystatin ointment (MYCOSTATIN) Apply topically.     olmesartan (BENICAR) 40 MG tablet      omeprazole (PRILOSEC) 20 MG capsule 1 capsule     tiZANidine (ZANAFLEX) 4 MG tablet 1 tablet as needed for muscle spasm     traMADol (ULTRAM) 50 MG tablet 1 tablet as needed for pain     TRELEGY ELLIPTA 100-62.5-25 MCG/INH AEPB Inhale 1 puff into the lungs daily.     triamcinolone (NASACORT ALLERGY 24HR CHILDREN) 55 MCG/ACT AERO nasal inhaler 1 spray in each nostril     No facility-administered medications prior to visit.    PAST MEDICAL HISTORY: Past Medical History:  Diagnosis Date   Anemia    Arthritis    knees   Diabetes mellitus without complication ([place])    GERD (gastroesophageal reflux disease)    Hypercholesterolemia    Hypertension    Hyperthyroidism    had "radiation to kill it"    PAST SURGICAL HISTORY: Past Surgical History:  Procedure Laterality Date   BREAST LUMPECTOMY WITH RADIOACTIVE SEED LOCALIZATION Right 12/23/2015   Procedure: RIGHT BREAST LUMPECTOMY WITH RADIOACTIVE SEED LOCALIZATION;  Surgeon: Coralie Keens, MD;  Location: [place];  Service: General;  Laterality: Right;    COLONOSCOPY W/ POLYPECTOMY     COLONOSCOPY WITH PROPOFOL N/A 04/09/2016   Procedure: COLONOSCOPY WITH PROPOFOL;  Surgeon: Carol Ada, MD;  [facility];  Service: Endoscopy;  Laterality: N/A;   ESOPHAGOGASTRODUODENOSCOPY (EGD) WITH PROPOFOL N/A 04/09/2016   Procedure: ESOPHAGOGASTRODUODENOSCOPY (EGD) WITH PROPOFOL;  Surgeon: Carol Ada, MD;  [facility];  Service: Endoscopy;  Laterality: N/A;   GIVENS CAPSULE STUDY N/A 06/21/2016   Procedure: GIVENS CAPSULE STUDY;  Surgeon: Juanita Craver, MD;  [facility];  Service: Endoscopy;  Laterality: N/A;   KNEE ARTHROSCOPY      FAMILY HISTORY: Family History  Problem Relation Age of Onset   Diabetes Mother    Lymphoma Father    Leukemia Maternal Uncle    Cancer Paternal Grandfather        stomach   Heart disease Sister     SOCIAL HISTORY: Social History   Socioeconomic History   Marital status: Married    Spouse name: Herbie [place]   Number of children: 1   Years of education: 14   Highest education level: Not on file  Occupational History    Comment: Forensic scientist  Tobacco Use   Smoking status: Never   Smokeless tobacco: Never  Substance and Sexual Activity   Alcohol use: No    Alcohol/week: 0.0 standard drinks   Drug use: No   Sexual activity: Not on file  Other Topics Concern   Not on file  Social History Narrative   Lives with husband   Caffeine- coffee 1 cup daily   Social Determinants of Health   Financial Resource Strain: Not on file  Food Insecurity: Not on file  Transportation Needs: Not on file  Physical Activity: Not on file  Stress: Not on file  Social Connections: Not on file  Intimate Partner Violence: Not on file      PHYSICAL EXAM Generalized: Well developed, in no acute distress   Neurological examination  Mentation: Alert oriented to time, place, history taking. Follows all commands speech and language fluent Cranial nerve II-XII:Extraocular movements were  full. Facial symmetry noted. uvula tongue midline. Head turning and shoulder shrug  were normal and symmetric. Motor: Good strength throughout subjectively per patient Sensory: Sensory testing is intact to soft touch on all 4 extremities subjectively per patient Coordination: Cerebellar testing reveals good finger-nose-finger  Gait and station: Patient is able to stand from a seated position. gait is normal.  Reflexes: UTA  DIAGNOSTIC DATA (LABS, IMAGING, TESTING) - I reviewed patient records, labs, notes, testing and imaging myself where available.  Lab Results  Component Value Date   WBC 12.1 (H) 08/02/2018   HGB 11.5 (L) 08/02/2018   HCT 40.0 08/02/2018   MCV 85.1 08/02/2018   PLT 450 (H) 08/02/2018      Component Value Date/Time   NA 145 08/02/2018 1148   K 3.7 08/02/2018 1148   CL 107 08/02/2018 1148   CO2 28 08/02/2018 1148   GLUCOSE 119 (H) 08/02/2018 1148   BUN 12 08/02/2018 1148   CREATININE 0.73 08/02/2018 1148   CALCIUM 9.1 08/02/2018 1148   GFRNONAA >60 08/02/2018 1148   GFRAA >60 08/02/2018 1148      ASSESSMENT AND PLAN 65 y.o. year old female  has a past medical history of Anemia, Arthritis, Diabetes mellitus without complication ([place]), GERD (gastroesophageal reflux disease), Hypercholesterolemia, Hypertension, and Hyperthyroidism. here with:  OSA on CPAP  CPAP compliance excellent Residual AHI is good Encouraged patient to continue using CPAP nightly and > 4 hours each night F/U in 1 year or sooner if needed    Ward Givens, MSN, NP-C 06/22/2021, 1:01 PM [facility] [address] [place], [place] [address] [phone]

## 2021-06-24 DIAGNOSIS — I10 Essential (primary) hypertension: Secondary | ICD-10-CM | POA: Diagnosis not present

## 2021-06-24 DIAGNOSIS — E1065 Type 1 diabetes mellitus with hyperglycemia: Secondary | ICD-10-CM | POA: Diagnosis not present

## 2021-06-24 DIAGNOSIS — M170 Bilateral primary osteoarthritis of knee: Secondary | ICD-10-CM | POA: Diagnosis not present

## 2021-06-24 DIAGNOSIS — E782 Mixed hyperlipidemia: Secondary | ICD-10-CM | POA: Diagnosis not present

## 2021-07-02 DIAGNOSIS — I10 Essential (primary) hypertension: Secondary | ICD-10-CM | POA: Diagnosis not present

## 2021-07-02 DIAGNOSIS — E1065 Type 1 diabetes mellitus with hyperglycemia: Secondary | ICD-10-CM | POA: Diagnosis not present

## 2021-07-09 DIAGNOSIS — I10 Essential (primary) hypertension: Secondary | ICD-10-CM | POA: Diagnosis not present

## 2021-07-09 DIAGNOSIS — E782 Mixed hyperlipidemia: Secondary | ICD-10-CM | POA: Diagnosis not present

## 2021-07-09 DIAGNOSIS — Z23 Encounter for immunization: Secondary | ICD-10-CM | POA: Diagnosis not present

## 2021-07-09 DIAGNOSIS — E1065 Type 1 diabetes mellitus with hyperglycemia: Secondary | ICD-10-CM | POA: Diagnosis not present

## 2021-07-24 DIAGNOSIS — M170 Bilateral primary osteoarthritis of knee: Secondary | ICD-10-CM | POA: Diagnosis not present

## 2021-07-24 DIAGNOSIS — E1065 Type 1 diabetes mellitus with hyperglycemia: Secondary | ICD-10-CM | POA: Diagnosis not present

## 2021-07-24 DIAGNOSIS — I10 Essential (primary) hypertension: Secondary | ICD-10-CM | POA: Diagnosis not present

## 2021-07-24 DIAGNOSIS — E782 Mixed hyperlipidemia: Secondary | ICD-10-CM | POA: Diagnosis not present

## 2021-08-10 DIAGNOSIS — G4733 Obstructive sleep apnea (adult) (pediatric): Secondary | ICD-10-CM | POA: Diagnosis not present

## 2021-08-21 ENCOUNTER — Encounter: Payer: Self-pay | Admitting: Podiatry

## 2021-08-21 ENCOUNTER — Other Ambulatory Visit: Payer: Self-pay

## 2021-08-21 ENCOUNTER — Ambulatory Visit: Payer: Medicare Other | Admitting: Podiatry

## 2021-08-21 DIAGNOSIS — M79675 Pain in left toe(s): Secondary | ICD-10-CM

## 2021-08-21 DIAGNOSIS — B351 Tinea unguium: Secondary | ICD-10-CM | POA: Diagnosis not present

## 2021-08-21 DIAGNOSIS — M79674 Pain in right toe(s): Secondary | ICD-10-CM

## 2021-08-21 DIAGNOSIS — E1142 Type 2 diabetes mellitus with diabetic polyneuropathy: Secondary | ICD-10-CM

## 2021-08-21 NOTE — Progress Notes (Signed)
Subjective: Sarah House is a 65 y.o. female patient seen today for follow up of at risk foot care. She has h/o diabetic neuropathy and painful thick toenails that are difficult to trim. Pain interferes with ambulation. Aggravating factors include wearing enclosed shoe gear. Pain is relieved with periodic professional debridement.  New problems reported today: None.  Patient states their blood glucose was 99 mg/dl today.   PCP is [name], MD. Last visit was: 07/09/2021.  No Known Allergies  Objective: Physical Exam  General: Patient is a pleasant 65 y.o. African American female obese in NAD. AAO x 3.   Neurovascular Examination: CFT <3 seconds b/l LE. Faintly palpable pedal pulses b/l. Pedal hair absent. Lower extremity skin temperature gradient within normal limits. No pain with calf compression RLE. Trace edema noted BLE.  Pt has subjective symptoms of neuropathy. Protective sensation intact 5/5 intact bilaterally with 10g monofilament b/l. Vibratory sensation intact b/l.  Dermatological:  Pedal skin is warm and supple b/l LE. No open wounds b/l LE. No interdigital macerations noted b/l LE. Toenails 1-5 b/l elongated, discolored, dystrophic, thickened, crumbly with subungual debris and tenderness to dorsal palpation.  Musculoskeletal:  Normal muscle strength 5/5 to all lower extremity muscle groups bilaterally. Hammertoe(s) noted to the L 2nd toe and R 2nd toe. Pes planus deformity noted b/l lower extremities.  Assessment: 1. Pain due to onychomycosis of toenails of both feet   2. Diabetic peripheral neuropathy associated with type 2 diabetes mellitus ([place])    Plan: Patient was evaluated and treated and all questions answered. Consent given for treatment as described below: -Continue diabetic foot care principles: inspect feet daily, monitor glucose as recommended by PCP and/or Endocrinologist, and follow prescribed diet per PCP, Endocrinologist and/or  dietician. -Toenails 1-5 b/l were debrided in length and girth with sterile nail nippers and dremel without iatrogenic bleeding.  -Patient/POA to call should there be question/concern in the interim.  Return in about 3 months (around 11/21/2021).  Marzetta Board, DPM

## 2021-08-24 DIAGNOSIS — E782 Mixed hyperlipidemia: Secondary | ICD-10-CM | POA: Diagnosis not present

## 2021-08-24 DIAGNOSIS — E1065 Type 1 diabetes mellitus with hyperglycemia: Secondary | ICD-10-CM | POA: Diagnosis not present

## 2021-08-24 DIAGNOSIS — M170 Bilateral primary osteoarthritis of knee: Secondary | ICD-10-CM | POA: Diagnosis not present

## 2021-08-24 DIAGNOSIS — I10 Essential (primary) hypertension: Secondary | ICD-10-CM | POA: Diagnosis not present

## 2021-08-31 DIAGNOSIS — H2513 Age-related nuclear cataract, bilateral: Secondary | ICD-10-CM | POA: Diagnosis not present

## 2021-08-31 DIAGNOSIS — E119 Type 2 diabetes mellitus without complications: Secondary | ICD-10-CM | POA: Diagnosis not present

## 2021-08-31 DIAGNOSIS — H5213 Myopia, bilateral: Secondary | ICD-10-CM | POA: Diagnosis not present

## 2021-08-31 DIAGNOSIS — H524 Presbyopia: Secondary | ICD-10-CM | POA: Diagnosis not present

## 2021-08-31 DIAGNOSIS — H52223 Regular astigmatism, bilateral: Secondary | ICD-10-CM | POA: Diagnosis not present

## 2021-10-23 DIAGNOSIS — E782 Mixed hyperlipidemia: Secondary | ICD-10-CM | POA: Diagnosis not present

## 2021-10-23 DIAGNOSIS — M170 Bilateral primary osteoarthritis of knee: Secondary | ICD-10-CM | POA: Diagnosis not present

## 2021-10-23 DIAGNOSIS — I10 Essential (primary) hypertension: Secondary | ICD-10-CM | POA: Diagnosis not present

## 2021-10-23 DIAGNOSIS — E1065 Type 1 diabetes mellitus with hyperglycemia: Secondary | ICD-10-CM | POA: Diagnosis not present

## 2021-11-09 DIAGNOSIS — G4733 Obstructive sleep apnea (adult) (pediatric): Secondary | ICD-10-CM | POA: Diagnosis not present

## 2021-11-26 DIAGNOSIS — E782 Mixed hyperlipidemia: Secondary | ICD-10-CM | POA: Diagnosis not present

## 2021-11-26 DIAGNOSIS — I10 Essential (primary) hypertension: Secondary | ICD-10-CM | POA: Diagnosis not present

## 2021-11-26 DIAGNOSIS — E1065 Type 1 diabetes mellitus with hyperglycemia: Secondary | ICD-10-CM | POA: Diagnosis not present

## 2021-12-02 ENCOUNTER — Ambulatory Visit: Payer: Medicare Other | Admitting: Podiatry

## 2021-12-02 ENCOUNTER — Other Ambulatory Visit: Payer: Self-pay

## 2021-12-02 ENCOUNTER — Ambulatory Visit: Payer: Medicare Other

## 2021-12-02 DIAGNOSIS — M2141 Flat foot [pes planus] (acquired), right foot: Secondary | ICD-10-CM

## 2021-12-02 DIAGNOSIS — M2041 Other hammer toe(s) (acquired), right foot: Secondary | ICD-10-CM

## 2021-12-02 DIAGNOSIS — B351 Tinea unguium: Secondary | ICD-10-CM

## 2021-12-02 DIAGNOSIS — M79674 Pain in right toe(s): Secondary | ICD-10-CM | POA: Diagnosis not present

## 2021-12-02 DIAGNOSIS — E1142 Type 2 diabetes mellitus with diabetic polyneuropathy: Secondary | ICD-10-CM | POA: Diagnosis not present

## 2021-12-02 DIAGNOSIS — M79675 Pain in left toe(s): Secondary | ICD-10-CM | POA: Diagnosis not present

## 2021-12-02 DIAGNOSIS — E119 Type 2 diabetes mellitus without complications: Secondary | ICD-10-CM

## 2021-12-02 NOTE — Progress Notes (Signed)
SITUATION Reason for Consult: Evaluation for Prefabricated Diabetic Shoes and Bilateral Custom Diabetic Inserts. Patient / Caregiver Report: Patient would like well fitting shoes  OBJECTIVE DATA: Patient History / Diagnosis:    ICD-10-CM   1. Controlled type 2 diabetes mellitus without complication, without long-term current use of insulin (HCC)  E11.9     2. Pes planus of both feet  M21.41    M21.42     3. Acquired hammertoes of both feet  M20.41    M20.42       Current or Previous Devices:   Historical DB shoe user  In-Person Foot Examination: Ulcers & Callousing:   None and no history  Toe / Foot Deformities:   - Pes Planus  - Hammertoes   Shoe Size: 12M  ORTHOTIC RECOMMENDATION Recommended Devices: - 1x pair prefabricated PDAC approved diabetic shoes: Patient selects [id number] 12M - 3x pair custom-to-patient vacuum formed diabetic insoles.   GOALS OF SHOES AND INSOLES - Reduce shear and pressure - Reduce / Prevent callus formation - Reduce / Prevent ulceration - Protect the fragile healing compromised diabetic foot.  Patient would benefit from diabetic shoes and inserts as patient has diabetes mellitus and the patient has one or more of the following conditions: - History of partial or complete amputation of the foot - History of previous foot ulceration. - History of pre-ulcerative callus - Peripheral neuropathy with evidence of callus formation - Foot deformity - Poor circulation  ACTIONS PERFORMED Patient was casted for insoles via crush box and measured for shoes via brannock device. Procedure was explained and patient tolerated procedure well. All questions were answered and concerns addressed.  PLAN Patient is to ensure treating physician receives and completes diabetic paperwork. Casts and shoe order are to be held until paperwork is received. Once received patient is to be scheduled for fitting in four weeks.

## 2021-12-03 DIAGNOSIS — E1065 Type 1 diabetes mellitus with hyperglycemia: Secondary | ICD-10-CM | POA: Diagnosis not present

## 2021-12-03 DIAGNOSIS — I10 Essential (primary) hypertension: Secondary | ICD-10-CM | POA: Diagnosis not present

## 2021-12-03 DIAGNOSIS — E782 Mixed hyperlipidemia: Secondary | ICD-10-CM | POA: Diagnosis not present

## 2021-12-03 DIAGNOSIS — E1042 Type 1 diabetes mellitus with diabetic polyneuropathy: Secondary | ICD-10-CM | POA: Diagnosis not present

## 2021-12-08 ENCOUNTER — Encounter: Payer: Self-pay | Admitting: Podiatry

## 2021-12-08 NOTE — Progress Notes (Signed)
°  Subjective:  Patient ID: Sarah House, female    DOB: 06-Dec-1955,  MRN: [id number]  SHARYN BRILLIANT presents to clinic today for at risk foot care with history of diabetic neuropathy and painful thick toenails that are difficult to trim. Pain interferes with ambulation. Aggravating factors include wearing enclosed shoe gear. Pain is relieved with periodic professional debridement.  Patient states blood glucose was 112 mg/dl today.    New problem(s): None.   PCP is [name], MD , and last visit was November 26, 2021.  No Known Allergies  Review of Systems: Negative except as noted in the HPI. Objective:   [name] is a pleasant 66 y.o. African American female, morbidly obese in NAD. AAO x 3.   Vascular CFT <3 seconds b/l LE. Faintly palpable DP pulses b/l LE. Diminished PT pulse(s) b/l LE. Pedal hair absent. No pain with calf compression b/l. Lower extremity skin temperature gradient within normal limits. Trace edema noted BLE. No ischemia or gangrene noted b/l LE. No cyanosis or clubbing noted b/l LE.  Neurologic Normal speech. Oriented to person, place, and time. Pt has subjective symptoms of neuropathy. Protective sensation intact 5/5 intact bilaterally with 10g monofilament b/l. Vibratory sensation intact b/l.  Dermatologic Pedal integument with normal turgor, texture and tone b/l LE. No open wounds b/l. No interdigital macerations b/l. Toenails 1-5 b/l elongated, thickened, discolored with subungual debris. +Tenderness with dorsal palpation of nailplates. No hyperkeratotic or porokeratotic lesions present.  Orthopedic: Normal muscle strength 5/5 to all lower extremity muscle groups bilaterally. Hammertoe(s) noted to the bilateral 2nd toes. Pes planus deformity noted bilateral LE.Marland Kitchen No pain, crepitus or joint limitation noted with ROM b/l LE.  Patient ambulates independently without assistive aids.   Radiographs: None  Last A1c: No flowsheet data  found.   Assessment:   1. Pain due to onychomycosis of toenails of both feet   2. Diabetic peripheral neuropathy associated with type 2 diabetes mellitus ([place])    Plan:  Patient was evaluated and treated and all questions answered. Consent given for treatment as described below: -Toenails 1-5 left and 1-5 right debrided in length and girth without iatrogenic bleeding with sterile nail nipper and dremel.  -Patient/POA to call should there be question/concern in the interim.  Return in about 9 weeks (around 02/03/2022).  Marzetta Board, DPM

## 2021-12-10 DIAGNOSIS — I10 Essential (primary) hypertension: Secondary | ICD-10-CM | POA: Diagnosis not present

## 2021-12-10 DIAGNOSIS — E1165 Type 2 diabetes mellitus with hyperglycemia: Secondary | ICD-10-CM | POA: Diagnosis not present

## 2021-12-10 DIAGNOSIS — Z794 Long term (current) use of insulin: Secondary | ICD-10-CM | POA: Diagnosis not present

## 2021-12-10 DIAGNOSIS — E782 Mixed hyperlipidemia: Secondary | ICD-10-CM | POA: Diagnosis not present

## 2021-12-14 ENCOUNTER — Telehealth: Payer: Self-pay

## 2021-12-14 NOTE — Telephone Encounter (Signed)
Casts Sent to [employer] - HOLD for CMN °

## 2021-12-22 DIAGNOSIS — E1065 Type 1 diabetes mellitus with hyperglycemia: Secondary | ICD-10-CM | POA: Diagnosis not present

## 2021-12-22 DIAGNOSIS — I10 Essential (primary) hypertension: Secondary | ICD-10-CM | POA: Diagnosis not present

## 2021-12-22 DIAGNOSIS — M170 Bilateral primary osteoarthritis of knee: Secondary | ICD-10-CM | POA: Diagnosis not present

## 2021-12-22 DIAGNOSIS — E782 Mixed hyperlipidemia: Secondary | ICD-10-CM | POA: Diagnosis not present

## 2021-12-23 DIAGNOSIS — E1065 Type 1 diabetes mellitus with hyperglycemia: Secondary | ICD-10-CM | POA: Diagnosis not present

## 2022-01-20 ENCOUNTER — Telehealth: Payer: Self-pay

## 2022-01-20 NOTE — Telephone Encounter (Signed)
Shoes ordered - [employer] Blue 843 was unavailable in [place]. Substituted Men's [facility] 529 in [place] as the fabric and style is identical. ?

## 2022-01-23 DIAGNOSIS — E1065 Type 1 diabetes mellitus with hyperglycemia: Secondary | ICD-10-CM | POA: Diagnosis not present

## 2022-01-27 DIAGNOSIS — E782 Mixed hyperlipidemia: Secondary | ICD-10-CM | POA: Diagnosis not present

## 2022-01-27 DIAGNOSIS — I10 Essential (primary) hypertension: Secondary | ICD-10-CM | POA: Diagnosis not present

## 2022-01-27 DIAGNOSIS — Z794 Long term (current) use of insulin: Secondary | ICD-10-CM | POA: Diagnosis not present

## 2022-01-27 DIAGNOSIS — E1165 Type 2 diabetes mellitus with hyperglycemia: Secondary | ICD-10-CM | POA: Diagnosis not present

## 2022-02-03 ENCOUNTER — Ambulatory Visit: Payer: Medicare Other

## 2022-02-03 DIAGNOSIS — M2041 Other hammer toe(s) (acquired), right foot: Secondary | ICD-10-CM

## 2022-02-03 DIAGNOSIS — E119 Type 2 diabetes mellitus without complications: Secondary | ICD-10-CM

## 2022-02-03 DIAGNOSIS — M2141 Flat foot [pes planus] (acquired), right foot: Secondary | ICD-10-CM

## 2022-02-03 NOTE — Progress Notes (Signed)
SITUATION ?Reason for Visit: Fitting of Diabetic [place] ?Patient / Caregiver Report:  Patient is satisfied with fit and function of shoes and insoles. ? ?OBJECTIVE DATA: ?Patient History / Diagnosis:   ?  ICD-10-CM   ?1. Controlled type 2 diabetes mellitus without complication, without long-term current use of insulin (HCC)  E11.9   ?  ?2. Pes planus of both feet  M21.41   ? M21.42   ?  ?3. Acquired hammertoes of both feet  M20.41   ? M20.42   ?  ? ? ?Change in Status:   None ? ?ACTIONS PERFORMED: ?In-Person Delivery, patient was fit with: ?- 1x pair [id number] PDAC approved prefabricated Diabetic Shoes: [employer] Men's [place] Blue 35M ?- 3x pair [id number] PDAC approved vacuum formed custom diabetic insoles; [employer]: [id number] ? ?Shoes and insoles were verified for structural integrity and safety. Patient wore shoes and insoles in office. Skin was inspected and free of areas of concern after wearing shoes and inserts. Shoes and inserts fit properly. Patient / Caregiver provided with ferbal instruction and demonstration regarding donning, doffing, wear, care, proper fit, function, purpose, cleaning, and use of shoes and insoles ' and in all related precautions and risks and benefits regarding shoes and insoles. Patient / Caregiver was instructed to wear properly fitting socks with shoes at all times. Patient was also provided with verbal instruction regarding how to report any failures or malfunctions of shoes or inserts, and necessary follow up care. Patient / Caregiver was also instructed to contact physician regarding change in status that may affect function of shoes and inserts.  ? ?Patient / Caregiver verbalized undersatnding of instruction provided. Patient / Caregiver demonstrated independence with proper donning and doffing of shoes and inserts. ? ?PLAN ?Patient to follow with treating physician as recommended. Plan of care was discussed with and agreed upon by patient and/or caregiver. All questions were  answered and concerns addressed. ? ?

## 2022-02-07 DIAGNOSIS — G4733 Obstructive sleep apnea (adult) (pediatric): Secondary | ICD-10-CM | POA: Diagnosis not present

## 2022-02-12 ENCOUNTER — Encounter: Payer: Self-pay | Admitting: Podiatry

## 2022-02-12 ENCOUNTER — Ambulatory Visit: Payer: Medicare Other | Admitting: Podiatry

## 2022-02-12 DIAGNOSIS — M2041 Other hammer toe(s) (acquired), right foot: Secondary | ICD-10-CM | POA: Diagnosis not present

## 2022-02-12 DIAGNOSIS — M79675 Pain in left toe(s): Secondary | ICD-10-CM | POA: Diagnosis not present

## 2022-02-12 DIAGNOSIS — M2042 Other hammer toe(s) (acquired), left foot: Secondary | ICD-10-CM | POA: Diagnosis not present

## 2022-02-12 DIAGNOSIS — Z794 Long term (current) use of insulin: Secondary | ICD-10-CM | POA: Insufficient documentation

## 2022-02-12 DIAGNOSIS — B351 Tinea unguium: Secondary | ICD-10-CM

## 2022-02-12 DIAGNOSIS — M79674 Pain in right toe(s): Secondary | ICD-10-CM

## 2022-02-12 DIAGNOSIS — M2142 Flat foot [pes planus] (acquired), left foot: Secondary | ICD-10-CM | POA: Diagnosis not present

## 2022-02-12 DIAGNOSIS — E1142 Type 2 diabetes mellitus with diabetic polyneuropathy: Secondary | ICD-10-CM | POA: Diagnosis not present

## 2022-02-12 DIAGNOSIS — I739 Peripheral vascular disease, unspecified: Secondary | ICD-10-CM | POA: Diagnosis not present

## 2022-02-12 DIAGNOSIS — M2141 Flat foot [pes planus] (acquired), right foot: Secondary | ICD-10-CM | POA: Diagnosis not present

## 2022-02-12 DIAGNOSIS — E1042 Type 1 diabetes mellitus with diabetic polyneuropathy: Secondary | ICD-10-CM | POA: Insufficient documentation

## 2022-02-12 DIAGNOSIS — E119 Type 2 diabetes mellitus without complications: Secondary | ICD-10-CM

## 2022-02-21 NOTE — Progress Notes (Signed)
ANNUAL DIABETIC FOOT EXAM ? ?Subjective: ?Sarah House presents today for annual diabetic foot examination. ? ?Patient relates 10 year h/o diabetes. ? ?Patient denies any h/o foot wounds. ? ?Patient denies any numbness, tingling, burning, or pins/needle sensation in feet. ? ?Patient's blood sugar was 97 mg/dl today. Last known  HgA1c was 8.1%  ? ?Risk factors: diabetes, diabetic neuropathy, HTN, hyperlipidemia. ? ?[name], MD is patient's PCP. Last visit was August 24, 2021. ? ?Past Medical History:  ?Diagnosis Date  ? Anemia   ? Arthritis   ? knees  ? Diabetes mellitus without complication ([place])   ? GERD (gastroesophageal reflux disease)   ? Hypercholesterolemia   ? Hypertension   ? Hyperthyroidism   ? had "radiation to kill it"  ? ?Patient Active Problem List  ? Diagnosis Date Noted  ? Long term (current) use of insulin ([place]) 02/12/2022  ? Type 1 diabetes mellitus with diabetic polyneuropathy ([place]) 02/12/2022  ? Arthritis of both knees 01/30/2021  ? Celiac disease 01/30/2021  ? Encounter for general adult medical examination without abnormal findings 01/30/2021  ? Essential hypertension 01/30/2021  ? Gastroesophageal reflux disease 01/30/2021  ? Iron deficiency anemia due to chronic blood loss 01/30/2021  ? Migraine 01/30/2021  ? Mixed hyperlipidemia 01/30/2021  ? Morbid obesity ([place]) 01/30/2021  ? Posterior reversible encephalopathy syndrome 01/30/2021  ? Thyrotoxicosis 01/30/2021  ? Type 1 diabetes mellitus with hyperglycemia ([place]) 01/30/2021  ? Obstructive sleep apnea treated with continuous positive airway pressure (CPAP) 01/04/2019  ? Uncontrolled type 2 diabetes mellitus with hyperglycemia ([place]) 03/24/2018  ? ?Past Surgical History:  ?Procedure Laterality Date  ? BREAST LUMPECTOMY WITH RADIOACTIVE SEED LOCALIZATION Right 12/23/2015  ? Procedure: RIGHT BREAST LUMPECTOMY WITH RADIOACTIVE SEED LOCALIZATION;  Surgeon: Coralie Keens, MD;  Location: [facility];  Service: General;   Laterality: Right;  ? COLONOSCOPY W/ POLYPECTOMY    ? COLONOSCOPY WITH PROPOFOL N/A 04/09/2016  ? Procedure: COLONOSCOPY WITH PROPOFOL;  Surgeon: Carol Ada, MD;  Location: [facility];  Service: Endoscopy;  Laterality: N/A;  ? ESOPHAGOGASTRODUODENOSCOPY (EGD) WITH PROPOFOL N/A 04/09/2016  ? Procedure: ESOPHAGOGASTRODUODENOSCOPY (EGD) WITH PROPOFOL;  Surgeon: Carol Ada, MD;  Location: [facility];  Service: Endoscopy;  Laterality: N/A;  ? GIVENS CAPSULE STUDY N/A 06/21/2016  ? Procedure: GIVENS CAPSULE STUDY;  Surgeon: Juanita Craver, MD;  Location: [facility] ENDOSCOPY;  Service: Endoscopy;  Laterality: N/A;  ? KNEE ARTHROSCOPY    ? ?Current Outpatient Medications on File Prior to Visit  ?Medication Sig Dispense Refill  ? Semaglutide,0.25 or 0.5MG/DOS, (OZEMPIC, 0.25 OR 0.5 MG/DOSE,) 2 MG/1.5ML SOPN 0.5 mg    ? tiZANidine (ZANAFLEX) 4 MG tablet 1 tablet as needed for muscle spasm    ? traMADol (ULTRAM) 50 MG tablet 1 tablet as needed for pain    ? ACCU-CHEK GUIDE test strip TAKE AS DIRECTED TWICE A DAY    ? amLODipine (NORVASC) 5 MG tablet Take 5 mg by mouth daily.    ? amoxicillin (AMOXIL) 500 MG capsule Take 500 mg by mouth 3 (three) times daily.    ? amoxicillin-clavulanate (AUGMENTIN) 875-125 MG tablet Take 1 tablet by mouth 2 (two) times daily.    ? aspirin (EC-81 ASPIRIN) 81 MG EC tablet 1 tablet    ? aspirin EC 81 MG tablet Take 81 mg by mouth daily.    ? atorvastatin (LIPITOR) 20 MG tablet Take 20 mg by mouth daily.    ? atorvastatin (LIPITOR) 40 MG tablet 1 tablet    ? azithromycin (ZITHROMAX)  250 MG tablet TAKE 2 TABLETS BY MOUTH ON DAY 1, THEN TAKE 1 TABLET DAILY ON DAYS 2-5    ? Blood Glucose Monitoring Suppl (ACCU-CHEK GUIDE) w/Device KIT See admin instructions.    ? calcium carbonate (CALCIUM 600) 1500 (600 Ca) MG TABS tablet 1 tablet with meals    ? Calcium Carbonate-Vitamin D (CALCIUM 500 + D PO) Take 1 tablet by mouth daily.    ? cetirizine (ZYRTEC ALLERGY) 10 MG tablet 1 tablet as needed    ?  chlorhexidine (PERIDEX) 0.12 % solution SMARTSIG:15 Milliliter(s) By Mouth 3 Times Daily    ? clotrimazole-betamethasone (LOTRISONE) cream 1 application to affected area    ? cyclobenzaprine (FLEXERIL) 10 MG tablet Take 10 mg by mouth 3 (three) times daily as needed for muscle spasms.     ? fluconazole (DIFLUCAN) 150 MG tablet Take 150 mg by mouth once.    ? Fluticasone-Umeclidin-Vilant (TRELEGY ELLIPTA IN) 1 puff    ? glimepiride (AMARYL) 4 MG tablet Take 8 mg by mouth daily with breakfast.    ? Glucagon (GVOKE HYPOPEN 2-PACK) 1 MG/0.2ML SOAJ See admin instructions.    ? glucose blood test strip 3 (three) times daily.    ? glucose blood test strip See admin instructions.    ? glucose blood test strip See admin instructions.    ? hydrochlorothiazide (HYDRODIURIL) 25 MG tablet     ? HYDROcodone-acetaminophen (NORCO/VICODIN) 5-325 MG tablet Take 1 tablet by mouth every 6 (six) hours as needed.    ? Insulin Degludec (TRESIBA FLEXTOUCH) 200 UNIT/ML SOPN Inject 36 Units into the skin at bedtime.     ? insulin lispro (HUMALOG KWIKPEN) 100 UNIT/ML KiwkPen Inject 6 Units into the skin.     ? insulin lispro (HUMALOG) 100 UNIT/ML KwikPen SMARTSIG:6 Unit(s) SUB-Q Daily    ? Insulin Pen Needle (B-D ULTRAFINE III SHORT PEN) 31G X 8 MM MISC See admin instructions.    ? ketoconazole (NIZORAL) 2 % cream 1 application to affected area    ? losartan-hydrochlorothiazide (HYZAAR) 100-25 MG per tablet Take 1 tablet by mouth daily.    ? meloxicam (MOBIC) 15 MG tablet Take 15 mg by mouth daily as needed for pain.     ? meloxicam (MOBIC) 15 MG tablet 1 tablet    ? metFORMIN (GLUCOPHAGE-XR) 500 MG 24 hr tablet 2 tablets    ? metFORMIN (GLUCOPHAGE-XR) 500 MG 24 hr tablet Take 2 tablets by mouth 2 (two) times daily.    ? METROGEL 1 % gel Apply 1 application topically daily as needed (Apply to affected area.).     ? metroNIDAZOLE (FLAGYL) 500 MG tablet Take 500 mg by mouth 3 (three) times daily.    ? Multiple Vitamin (MULTI-VITAMIN DAILY PO)      ? Multiple Vitamin (MULTIVITAMIN WITH MINERALS) TABS tablet Take 1 tablet by mouth daily.    ? nystatin ointment (MYCOSTATIN) Apply topically.    ? olmesartan (BENICAR) 40 MG tablet     ? omeprazole (PRILOSEC) 20 MG capsule 1 capsule    ? ondansetron (ZOFRAN) 4 MG tablet Take 4 mg by mouth every 8 (eight) hours as needed.    ? tiZANidine (ZANAFLEX) 4 MG tablet 1 tablet as needed for muscle spasm    ? traMADol (ULTRAM) 50 MG tablet 1 tablet as needed for pain    ? TRELEGY ELLIPTA 100-62.5-25 MCG/INH AEPB Inhale 1 puff into the lungs daily.    ? triamcinolone (NASACORT ALLERGY 24HR CHILDREN) 55 MCG/ACT AERO nasal inhaler  1 spray in each nostril    ? ?No current facility-administered medications on file prior to visit.  ?  ?No Known Allergies ?Social History  ? ?Occupational History  ?  Comment: customer service rep [employer]  ?Tobacco Use  ? Smoking status: Never  ? Smokeless tobacco: Never  ?Substance and Sexual Activity  ? Alcohol use: No  ?  Alcohol/week: 0.0 standard drinks  ? Drug use: No  ? Sexual activity: Not on file  ? ?Family History  ?Problem Relation Age of Onset  ? Diabetes Mother   ? Lymphoma Father   ? Leukemia Maternal Uncle   ? Cancer Paternal Grandfather   ?     stomach  ? Heart disease Sister   ? ?Immunization History  ?Administered Date(s) Administered  ? Influenza Inj Mdck Quad Pf 09/19/2017  ? Influenza,inj,Quad PF,6+ Mos 07/19/2018  ?  ? ?Review of Systems: Negative except as noted in the HPI.  ? ?Objective: ?There were no vitals filed for this visit. ? ?[name] is a pleasant 66 y.o. female in NAD. AAO X 3. ? ?Vascular Examination: ?CFT <3 seconds b/l LE. Palpable DP pulse(s) b/l LE. Nonpalpable PT pulse(s) b/l LE. Trace edema noted BLE. No cyanosis or clubbing noted b/l LE. ? ?Dermatological Examination: ?Pedal skin is warm and supple b/l LE. No open wounds b/l LE. No interdigital macerations noted b/l LE. Toenails 1-5 b/l elongated, discolored, dystrophic, thickened, crumbly with  subungual debris and tenderness to dorsal palpation. No hyperkeratotic nor porokeratotic lesions present on today's visit. ? ?Neurological Examination: ?Pt has subjective symptoms of neuropathy. Protective sensati

## 2022-02-22 DIAGNOSIS — E1065 Type 1 diabetes mellitus with hyperglycemia: Secondary | ICD-10-CM | POA: Diagnosis not present

## 2022-03-04 DIAGNOSIS — E1165 Type 2 diabetes mellitus with hyperglycemia: Secondary | ICD-10-CM | POA: Diagnosis not present

## 2022-03-04 DIAGNOSIS — Z794 Long term (current) use of insulin: Secondary | ICD-10-CM | POA: Diagnosis not present

## 2022-03-04 DIAGNOSIS — E782 Mixed hyperlipidemia: Secondary | ICD-10-CM | POA: Diagnosis not present

## 2022-03-04 DIAGNOSIS — E1065 Type 1 diabetes mellitus with hyperglycemia: Secondary | ICD-10-CM | POA: Diagnosis not present

## 2022-03-04 DIAGNOSIS — I10 Essential (primary) hypertension: Secondary | ICD-10-CM | POA: Diagnosis not present

## 2022-03-25 DIAGNOSIS — E1065 Type 1 diabetes mellitus with hyperglycemia: Secondary | ICD-10-CM | POA: Diagnosis not present

## 2022-03-30 DIAGNOSIS — Z1212 Encounter for screening for malignant neoplasm of rectum: Secondary | ICD-10-CM | POA: Diagnosis not present

## 2022-04-01 DIAGNOSIS — I10 Essential (primary) hypertension: Secondary | ICD-10-CM | POA: Diagnosis not present

## 2022-04-01 DIAGNOSIS — E782 Mixed hyperlipidemia: Secondary | ICD-10-CM | POA: Diagnosis not present

## 2022-04-01 DIAGNOSIS — Z0000 Encounter for general adult medical examination without abnormal findings: Secondary | ICD-10-CM | POA: Diagnosis not present

## 2022-04-01 DIAGNOSIS — E1065 Type 1 diabetes mellitus with hyperglycemia: Secondary | ICD-10-CM | POA: Diagnosis not present

## 2022-04-01 DIAGNOSIS — R5383 Other fatigue: Secondary | ICD-10-CM | POA: Diagnosis not present

## 2022-04-08 DIAGNOSIS — E782 Mixed hyperlipidemia: Secondary | ICD-10-CM | POA: Diagnosis not present

## 2022-04-08 DIAGNOSIS — Z0000 Encounter for general adult medical examination without abnormal findings: Secondary | ICD-10-CM | POA: Diagnosis not present

## 2022-04-08 DIAGNOSIS — D508 Other iron deficiency anemias: Secondary | ICD-10-CM | POA: Diagnosis not present

## 2022-04-08 DIAGNOSIS — K900 Celiac disease: Secondary | ICD-10-CM | POA: Diagnosis not present

## 2022-04-08 DIAGNOSIS — E1065 Type 1 diabetes mellitus with hyperglycemia: Secondary | ICD-10-CM | POA: Diagnosis not present

## 2022-04-08 DIAGNOSIS — G4733 Obstructive sleep apnea (adult) (pediatric): Secondary | ICD-10-CM | POA: Diagnosis not present

## 2022-04-08 DIAGNOSIS — I10 Essential (primary) hypertension: Secondary | ICD-10-CM | POA: Diagnosis not present

## 2022-04-24 DIAGNOSIS — E1065 Type 1 diabetes mellitus with hyperglycemia: Secondary | ICD-10-CM | POA: Diagnosis not present

## 2022-05-03 DIAGNOSIS — D500 Iron deficiency anemia secondary to blood loss (chronic): Secondary | ICD-10-CM | POA: Diagnosis not present

## 2022-05-09 DIAGNOSIS — G4733 Obstructive sleep apnea (adult) (pediatric): Secondary | ICD-10-CM | POA: Diagnosis not present

## 2022-05-10 DIAGNOSIS — D508 Other iron deficiency anemias: Secondary | ICD-10-CM | POA: Diagnosis not present

## 2022-05-10 DIAGNOSIS — K900 Celiac disease: Secondary | ICD-10-CM | POA: Diagnosis not present

## 2022-05-10 DIAGNOSIS — I10 Essential (primary) hypertension: Secondary | ICD-10-CM | POA: Diagnosis not present

## 2022-05-10 DIAGNOSIS — E1065 Type 1 diabetes mellitus with hyperglycemia: Secondary | ICD-10-CM | POA: Diagnosis not present

## 2022-05-10 DIAGNOSIS — Z794 Long term (current) use of insulin: Secondary | ICD-10-CM | POA: Diagnosis not present

## 2022-05-10 DIAGNOSIS — E782 Mixed hyperlipidemia: Secondary | ICD-10-CM | POA: Diagnosis not present

## 2022-05-10 DIAGNOSIS — G4733 Obstructive sleep apnea (adult) (pediatric): Secondary | ICD-10-CM | POA: Diagnosis not present

## 2022-05-18 ENCOUNTER — Ambulatory Visit: Payer: Medicare Other | Admitting: Podiatry

## 2022-05-18 ENCOUNTER — Encounter: Payer: Self-pay | Admitting: Podiatry

## 2022-05-18 DIAGNOSIS — M79675 Pain in left toe(s): Secondary | ICD-10-CM

## 2022-05-18 DIAGNOSIS — E1142 Type 2 diabetes mellitus with diabetic polyneuropathy: Secondary | ICD-10-CM

## 2022-05-18 DIAGNOSIS — M79674 Pain in right toe(s): Secondary | ICD-10-CM

## 2022-05-18 DIAGNOSIS — B351 Tinea unguium: Secondary | ICD-10-CM

## 2022-05-18 DIAGNOSIS — I739 Peripheral vascular disease, unspecified: Secondary | ICD-10-CM

## 2022-05-23 NOTE — Progress Notes (Signed)
  Subjective:  Patient ID: Sarah House, female    DOB: 1955/12/09,  MRN: [id number]  Sarah House presents to clinic today for at risk footcare. Patient has h/o diabetes, neuropathy and PAD and is seen for  and painful elongated mycotic toenails 1-5 bilaterally which are tender when wearing enclosed shoe gear. Pain is relieved with periodic professional debridement.  Patient states blood glucose was 93 mg/dl today.  Last A1c was 8.1%.  New problem(s): None.   PCP is [name], MD , and last visit was  May 10, 2022  No Known Allergies  Review of Systems: Negative except as noted in the HPI.  Objective: No changes noted in today's physical examination. Sarah House is a pleasant 66 y.o. female in NAD. AAO X 3.  Vascular Examination: CFT <3 seconds b/l LE. Palpable DP pulse(s) b/l LE. Nonpalpable PT pulse(s) b/l LE. Trace edema noted BLE. No cyanosis or clubbing noted b/l LE.  Dermatological Examination: Pedal skin is warm and supple b/l LE. No open wounds b/l LE. No interdigital macerations noted b/l LE. Toenails 1-5 b/l elongated, discolored, dystrophic, thickened, crumbly with subungual debris and tenderness to dorsal palpation. No hyperkeratotic nor porokeratotic lesions present on today's visit.  Neurological Examination: Pt has subjective symptoms of neuropathy. Protective sensation intact 5/5 intact bilaterally with 10g monofilament b/l. Vibratory sensation intact b/l.  Musculoskeletal Examination: Muscle strength 5/5 to all lower extremity muscle groups bilaterally. Hammertoe(s) noted to the bilateral 2nd toes. Pes planus deformity noted bilateral LE. Utilizes cane for ambulation assistance.  Assessment/Plan: 1. Pain due to onychomycosis of toenails of both feet   2. PAD (peripheral artery disease) ([place])   3. Diabetic peripheral neuropathy associated with type 2 diabetes mellitus ([place])     -Examined patient. -Patient to continue soft, supportive shoe  gear daily. -Toenails 1-5 b/l were debrided in length and girth with sterile nail nippers and dremel without iatrogenic bleeding.  -Patient/POA to call should there be question/concern in the interim.   Return in about 3 months (around 08/18/2022).  Marzetta Board, DPM

## 2022-05-25 DIAGNOSIS — E1065 Type 1 diabetes mellitus with hyperglycemia: Secondary | ICD-10-CM | POA: Diagnosis not present

## 2022-05-31 DIAGNOSIS — Z1231 Encounter for screening mammogram for malignant neoplasm of breast: Secondary | ICD-10-CM | POA: Diagnosis not present

## 2022-06-01 DIAGNOSIS — E782 Mixed hyperlipidemia: Secondary | ICD-10-CM | POA: Diagnosis not present

## 2022-06-07 DIAGNOSIS — E782 Mixed hyperlipidemia: Secondary | ICD-10-CM | POA: Diagnosis not present

## 2022-06-07 DIAGNOSIS — I10 Essential (primary) hypertension: Secondary | ICD-10-CM | POA: Diagnosis not present

## 2022-06-07 DIAGNOSIS — D500 Iron deficiency anemia secondary to blood loss (chronic): Secondary | ICD-10-CM | POA: Diagnosis not present

## 2022-06-07 DIAGNOSIS — Z794 Long term (current) use of insulin: Secondary | ICD-10-CM | POA: Diagnosis not present

## 2022-06-07 DIAGNOSIS — E1169 Type 2 diabetes mellitus with other specified complication: Secondary | ICD-10-CM | POA: Diagnosis not present

## 2022-06-21 ENCOUNTER — Encounter: Payer: Self-pay | Admitting: *Deleted

## 2022-06-21 NOTE — Progress Notes (Unsigned)
PATIENT: Sarah House DOB: [birth year]  REASON FOR VISIT: follow up HISTORY FROM: patient PRIMARY NEUROLOGIST: Dr. Brett Fairy  Virtual Visit via Video Note  I connected with Sarah House on 06/22/22 at  1:45 PM EDT by a video enabled telemedicine application located remotely at [facility] and verified that I am speaking with the correct person using two identifiers who was located at their own home.   I discussed the limitations of evaluation and management by telemedicine and the availability of in person appointments. The patient expressed understanding and agreed to proceed.   PATIENT: Sarah House DOB: 18-Nov-1955  REASON FOR VISIT: follow up HISTORY FROM: patient  HISTORY OF PRESENT ILLNESS: Today 06/22/22:  Ms. Greenspan is a 66 year old female with a history of obstructive sleep apnea on CPAP.  She returns today for follow-up.  Her download is below.  She reports that the CPAP is working well for her.  Denies any new issues.  She returns today for an evaluation.    Ms. Theodis Aguas  is a 66 y.o. female who has been followed in this office for obstructive sleep apnea on CPAP. She reports no issues and it is working well for her. Download is below.    HISTORY Ms. Korenek is a 66 year old female with a history of obstructive sleep apnea on CPAP.  Her download indicates that she use her machine nightly for compliance of 100%.  She used her machine greater than 4 hours each night.  On average she uses her machine 8 hours and 41 minutes.  Her residual AHI is 4.3 on 7 to 13 cm of water with EPR of 3.  Her leak in the 95th percentile is 2.6 L/min.  She reports that the CPAP is working well for her.  She can tell a difference in how she feels during the day.    REVIEW OF SYSTEMS: Out of a complete 14 system review of symptoms, the patient complains only of the following symptoms, and all other reviewed systems are negative.  ALLERGIES: No Known Allergies  HOME  MEDICATIONS: Outpatient Medications Prior to Visit  Medication Sig Dispense Refill   ACCU-CHEK GUIDE test strip TAKE AS DIRECTED TWICE A DAY     amLODipine (NORVASC) 5 MG tablet Take 5 mg by mouth daily.     amoxicillin (AMOXIL) 500 MG capsule Take 500 mg by mouth 3 (three) times daily.     amoxicillin-clavulanate (AUGMENTIN) 875-125 MG tablet Take 1 tablet by mouth 2 (two) times daily.     aspirin (EC-81 ASPIRIN) 81 MG EC tablet 1 tablet     aspirin EC 81 MG tablet Take 81 mg by mouth daily.     atorvastatin (LIPITOR) 20 MG tablet Take 20 mg by mouth daily.     atorvastatin (LIPITOR) 40 MG tablet 1 tablet     azithromycin (ZITHROMAX) 250 MG tablet TAKE 2 TABLETS BY MOUTH ON DAY 1, THEN TAKE 1 TABLET DAILY ON DAYS 2-5     Blood Glucose Monitoring Suppl (ACCU-CHEK GUIDE) w/Device KIT See admin instructions.     calcium carbonate (CALCIUM 600) 1500 (600 Ca) MG TABS tablet 1 tablet with meals     Calcium Carbonate-Vitamin D (CALCIUM 500 + D PO) Take 1 tablet by mouth daily.     cetirizine (ZYRTEC ALLERGY) 10 MG tablet 1 tablet as needed     chlorhexidine (PERIDEX) 0.12 % solution SMARTSIG:15 Milliliter(s) By Mouth 3 Times Daily     clotrimazole-betamethasone (LOTRISONE)  cream 1 application to affected area     cyclobenzaprine (FLEXERIL) 10 MG tablet Take 10 mg by mouth 3 (three) times daily as needed for muscle spasms.      fluconazole (DIFLUCAN) 150 MG tablet Take 150 mg by mouth once.     Fluticasone-Umeclidin-Vilant (TRELEGY ELLIPTA IN) 1 puff     glimepiride (AMARYL) 4 MG tablet Take 8 mg by mouth daily with breakfast.     Glucagon (GVOKE HYPOPEN 2-PACK) 1 MG/0.2ML SOAJ See admin instructions.     glucose blood test strip 3 (three) times daily.     glucose blood test strip See admin instructions.     glucose blood test strip See admin instructions.     hydrochlorothiazide (HYDRODIURIL) 25 MG tablet      HYDROcodone-acetaminophen (NORCO/VICODIN) 5-325 MG tablet Take 1 tablet by mouth every  6 (six) hours as needed.     Insulin Degludec (TRESIBA FLEXTOUCH) 200 UNIT/ML SOPN Inject 36 Units into the skin at bedtime.      insulin lispro (HUMALOG KWIKPEN) 100 UNIT/ML KiwkPen Inject 6 Units into the skin.      insulin lispro (HUMALOG) 100 UNIT/ML KwikPen SMARTSIG:6 Unit(s) SUB-Q Daily     Insulin Pen Needle (B-D ULTRAFINE III SHORT PEN) 31G X 8 MM MISC See admin instructions.     ketoconazole (NIZORAL) 2 % cream 1 application to affected area     losartan-hydrochlorothiazide (HYZAAR) 100-25 MG per tablet Take 1 tablet by mouth daily.     meloxicam (MOBIC) 15 MG tablet Take 15 mg by mouth daily as needed for pain.      meloxicam (MOBIC) 15 MG tablet 1 tablet     metFORMIN (GLUCOPHAGE-XR) 500 MG 24 hr tablet 2 tablets     metFORMIN (GLUCOPHAGE-XR) 500 MG 24 hr tablet Take 2 tablets by mouth 2 (two) times daily.     METROGEL 1 % gel Apply 1 application topically daily as needed (Apply to affected area.).      metroNIDAZOLE (FLAGYL) 500 MG tablet Take 500 mg by mouth 3 (three) times daily.     Multiple Vitamin (MULTI-VITAMIN DAILY PO)      Multiple Vitamin (MULTIVITAMIN WITH MINERALS) TABS tablet Take 1 tablet by mouth daily.     nystatin ointment (MYCOSTATIN) Apply topically.     olmesartan (BENICAR) 40 MG tablet      omeprazole (PRILOSEC) 20 MG capsule 1 capsule     ondansetron (ZOFRAN) 4 MG tablet Take 4 mg by mouth every 8 (eight) hours as needed.     Semaglutide,0.25 or 0.5MG/DOS, (OZEMPIC, 0.25 OR 0.5 MG/DOSE,) 2 MG/1.5ML SOPN 0.5 mg     tiZANidine (ZANAFLEX) 4 MG tablet 1 tablet as needed for muscle spasm     tiZANidine (ZANAFLEX) 4 MG tablet 1 tablet as needed for muscle spasm     traMADol (ULTRAM) 50 MG tablet 1 tablet as needed for pain     traMADol (ULTRAM) 50 MG tablet 1 tablet as needed for pain     TRELEGY ELLIPTA 100-62.5-25 MCG/INH AEPB Inhale 1 puff into the lungs daily.     triamcinolone (NASACORT ALLERGY 24HR CHILDREN) 55 MCG/ACT AERO nasal inhaler 1 spray in each  nostril     No facility-administered medications prior to visit.    PAST MEDICAL HISTORY: Past Medical History:  Diagnosis Date   Anemia    Arthritis    knees   Diabetes mellitus without complication (HCC)    GERD (gastroesophageal reflux disease)    Hypercholesterolemia  Hypertension    Hyperthyroidism    had "radiation to kill it"    PAST SURGICAL HISTORY: Past Surgical History:  Procedure Laterality Date   BREAST LUMPECTOMY WITH RADIOACTIVE SEED LOCALIZATION Right 12/23/2015   Procedure: RIGHT BREAST LUMPECTOMY WITH RADIOACTIVE SEED LOCALIZATION;  Surgeon: Coralie Keens, MD;  Location: [facility];  Service: General;  Laterality: Right;   COLONOSCOPY W/ POLYPECTOMY     COLONOSCOPY WITH PROPOFOL N/A 04/09/2016   Procedure: COLONOSCOPY WITH PROPOFOL;  Surgeon: Carol Ada, MD;  Location: [facility];  Service: Endoscopy;  Laterality: N/A;   ESOPHAGOGASTRODUODENOSCOPY (EGD) WITH PROPOFOL N/A 04/09/2016   Procedure: ESOPHAGOGASTRODUODENOSCOPY (EGD) WITH PROPOFOL;  Surgeon: Carol Ada, MD;  Location: [facility];  Service: Endoscopy;  Laterality: N/A;   GIVENS CAPSULE STUDY N/A 06/21/2016   Procedure: GIVENS CAPSULE STUDY;  Surgeon: Juanita Craver, MD;  Location: [facility];  Service: Endoscopy;  Laterality: N/A;   KNEE ARTHROSCOPY      FAMILY HISTORY: Family History  Problem Relation Age of Onset   Diabetes Mother    Lymphoma Father    Leukemia Maternal Uncle    Cancer Paternal Grandfather        stomach   Heart disease Sister     SOCIAL HISTORY: Social History   Socioeconomic History   Marital status: Married    Spouse name: Herbie [place]   Number of children: 1   Years of education: 14   Highest education level: Not on file  Occupational History    Comment: Forensic scientist  Tobacco Use   Smoking status: Never   Smokeless tobacco: Never  Substance and Sexual Activity   Alcohol use: No    Alcohol/week: 0.0 standard drinks of alcohol    Drug use: No   Sexual activity: Not on file  Other Topics Concern   Not on file  Social History Narrative   Lives with husband   Caffeine- coffee 1 cup daily   Social Determinants of Health   Financial Resource Strain: Not on file  Food Insecurity: Not on file  Transportation Needs: Not on file  Physical Activity: Not on file  Stress: Not on file  Social Connections: Not on file  Intimate Partner Violence: Not on file      PHYSICAL EXAM Generalized: Well developed, in no acute distress   Neurological examination  Mentation: Alert oriented to time, place, history taking. Follows all commands speech and language fluent Cranial nerve II-XII:Extraocular movements were full. Facial symmetry noted.  Head turning and shoulder shrug  were normal and symmetric. Reflexes: UTA  DIAGNOSTIC DATA (LABS, IMAGING, TESTING) - I reviewed patient records, labs, notes, testing and imaging myself where available.  Lab Results  Component Value Date   WBC 12.1 (H) 08/02/2018   HGB 11.5 (L) 08/02/2018   HCT 40.0 08/02/2018   MCV 85.1 08/02/2018   PLT 450 (H) 08/02/2018      Component Value Date/Time   NA 145 08/02/2018 1148   K 3.7 08/02/2018 1148   CL 107 08/02/2018 1148   CO2 28 08/02/2018 1148   GLUCOSE 119 (H) 08/02/2018 1148   BUN 12 08/02/2018 1148   CREATININE 0.73 08/02/2018 1148   CALCIUM 9.1 08/02/2018 1148   GFRNONAA >60 08/02/2018 1148   GFRAA >60 08/02/2018 1148      ASSESSMENT AND PLAN 66 y.o. year old female  has a past medical history of Anemia, Arthritis, Diabetes mellitus without complication ([place]), GERD (gastroesophageal reflux disease), Hypercholesterolemia, Hypertension, and Hyperthyroidism. here with:  OSA on CPAP  CPAP compliance excellent Residual AHI is good Encouraged patient to continue using CPAP nightly and > 4 hours each night F/U in 1 year or sooner if needed    Ward Givens, MSN, NP-C 06/22/2022, 1:39 PM [facility] [address] [place], [place] [address] [phone]

## 2022-06-22 ENCOUNTER — Telehealth: Payer: Medicare Other | Admitting: Adult Health

## 2022-06-22 DIAGNOSIS — G4733 Obstructive sleep apnea (adult) (pediatric): Secondary | ICD-10-CM | POA: Diagnosis not present

## 2022-06-22 DIAGNOSIS — Z9989 Dependence on other enabling machines and devices: Secondary | ICD-10-CM | POA: Diagnosis not present

## 2022-06-25 DIAGNOSIS — E1065 Type 1 diabetes mellitus with hyperglycemia: Secondary | ICD-10-CM | POA: Diagnosis not present

## 2022-07-21 DIAGNOSIS — I10 Essential (primary) hypertension: Secondary | ICD-10-CM | POA: Diagnosis not present

## 2022-07-21 DIAGNOSIS — D500 Iron deficiency anemia secondary to blood loss (chronic): Secondary | ICD-10-CM | POA: Diagnosis not present

## 2022-07-21 DIAGNOSIS — Z794 Long term (current) use of insulin: Secondary | ICD-10-CM | POA: Diagnosis not present

## 2022-07-21 DIAGNOSIS — E782 Mixed hyperlipidemia: Secondary | ICD-10-CM | POA: Diagnosis not present

## 2022-07-21 DIAGNOSIS — E1169 Type 2 diabetes mellitus with other specified complication: Secondary | ICD-10-CM | POA: Diagnosis not present

## 2022-07-25 DIAGNOSIS — E1065 Type 1 diabetes mellitus with hyperglycemia: Secondary | ICD-10-CM | POA: Diagnosis not present

## 2022-08-04 DIAGNOSIS — E1169 Type 2 diabetes mellitus with other specified complication: Secondary | ICD-10-CM | POA: Diagnosis not present

## 2022-08-04 DIAGNOSIS — E782 Mixed hyperlipidemia: Secondary | ICD-10-CM | POA: Diagnosis not present

## 2022-08-04 DIAGNOSIS — D500 Iron deficiency anemia secondary to blood loss (chronic): Secondary | ICD-10-CM | POA: Diagnosis not present

## 2022-08-04 DIAGNOSIS — I10 Essential (primary) hypertension: Secondary | ICD-10-CM | POA: Diagnosis not present

## 2022-08-04 DIAGNOSIS — Z794 Long term (current) use of insulin: Secondary | ICD-10-CM | POA: Diagnosis not present

## 2022-08-07 DIAGNOSIS — G4733 Obstructive sleep apnea (adult) (pediatric): Secondary | ICD-10-CM | POA: Diagnosis not present

## 2022-08-12 DIAGNOSIS — Z794 Long term (current) use of insulin: Secondary | ICD-10-CM | POA: Diagnosis not present

## 2022-08-12 DIAGNOSIS — E1169 Type 2 diabetes mellitus with other specified complication: Secondary | ICD-10-CM | POA: Diagnosis not present

## 2022-08-12 DIAGNOSIS — I10 Essential (primary) hypertension: Secondary | ICD-10-CM | POA: Diagnosis not present

## 2022-08-12 DIAGNOSIS — E782 Mixed hyperlipidemia: Secondary | ICD-10-CM | POA: Diagnosis not present

## 2022-08-12 DIAGNOSIS — D508 Other iron deficiency anemias: Secondary | ICD-10-CM | POA: Diagnosis not present

## 2022-08-12 DIAGNOSIS — E1065 Type 1 diabetes mellitus with hyperglycemia: Secondary | ICD-10-CM | POA: Diagnosis not present

## 2022-08-19 DIAGNOSIS — I10 Essential (primary) hypertension: Secondary | ICD-10-CM | POA: Diagnosis not present

## 2022-08-19 DIAGNOSIS — E1122 Type 2 diabetes mellitus with diabetic chronic kidney disease: Secondary | ICD-10-CM | POA: Diagnosis not present

## 2022-08-19 DIAGNOSIS — D508 Other iron deficiency anemias: Secondary | ICD-10-CM | POA: Diagnosis not present

## 2022-08-19 DIAGNOSIS — K900 Celiac disease: Secondary | ICD-10-CM | POA: Diagnosis not present

## 2022-08-19 DIAGNOSIS — N182 Chronic kidney disease, stage 2 (mild): Secondary | ICD-10-CM | POA: Diagnosis not present

## 2022-08-19 DIAGNOSIS — Z794 Long term (current) use of insulin: Secondary | ICD-10-CM | POA: Diagnosis not present

## 2022-08-19 DIAGNOSIS — G4733 Obstructive sleep apnea (adult) (pediatric): Secondary | ICD-10-CM | POA: Diagnosis not present

## 2022-08-19 DIAGNOSIS — E782 Mixed hyperlipidemia: Secondary | ICD-10-CM | POA: Diagnosis not present

## 2022-08-25 ENCOUNTER — Ambulatory Visit: Payer: Medicare Other | Admitting: Podiatry

## 2022-08-25 ENCOUNTER — Encounter: Payer: Self-pay | Admitting: Podiatry

## 2022-08-25 DIAGNOSIS — B351 Tinea unguium: Secondary | ICD-10-CM | POA: Diagnosis not present

## 2022-08-25 DIAGNOSIS — E1142 Type 2 diabetes mellitus with diabetic polyneuropathy: Secondary | ICD-10-CM

## 2022-08-25 DIAGNOSIS — M79674 Pain in right toe(s): Secondary | ICD-10-CM | POA: Diagnosis not present

## 2022-08-25 DIAGNOSIS — E1065 Type 1 diabetes mellitus with hyperglycemia: Secondary | ICD-10-CM | POA: Diagnosis not present

## 2022-08-25 DIAGNOSIS — I739 Peripheral vascular disease, unspecified: Secondary | ICD-10-CM

## 2022-08-25 DIAGNOSIS — M79675 Pain in left toe(s): Secondary | ICD-10-CM | POA: Diagnosis not present

## 2022-08-25 NOTE — Progress Notes (Signed)
  Subjective:  Patient ID: Sarah House, female    DOB: [birth year],  MRN: [id number]  AILENE ROYAL presents to clinic today for at risk foot care with history of diabetic neuropathy and painful elongated mycotic toenails 1-5 bilaterally which are tender when wearing enclosed shoe gear. Pain is relieved with periodic professional debridement.. Chief Complaint  Patient presents with   Nail Problem    Diabetic foot care BS-170 A1C-8.4 PCP-Ramachandran PCP VST-Last week   New problem(s): None.   PCP is [name], MD , and last visit was May 10, 2022.  No Known Allergies  Review of Systems: Negative except as noted in the HPI.  Objective: No changes noted in today's physical examination.  Sarah House is a pleasant 66 y.o. female obese in NAD. AAO x 3.  Vascular Examination: CFT <3 seconds b/l LE. Palpable DP pulse(s) b/l LE. Nonpalpable PT pulse(s) b/l LE. Trace edema noted BLE. No cyanosis or clubbing noted b/l LE.  Dermatological Examination: Pedal skin is warm and supple b/l LE. No open wounds b/l LE. No interdigital macerations noted b/l LE. Toenails 1-5 b/l elongated, discolored, dystrophic, thickened, crumbly with subungual debris and tenderness to dorsal palpation. No hyperkeratotic nor porokeratotic lesions present on today's visit.  Neurological Examination: Pt has subjective symptoms of neuropathy. Protective sensation intact 5/5 intact bilaterally with 10g monofilament b/l. Vibratory sensation intact b/l.  Musculoskeletal Examination: Muscle strength 5/5 to all lower extremity muscle groups bilaterally. Hammertoe(s) noted to the bilateral 2nd toes. Pes planus deformity noted bilateral LE. Utilizes cane for ambulation assistance.  Assessment/Plan: 1. Pain due to onychomycosis of toenails of both feet   2. PAD (peripheral artery disease) ([place])   3. Diabetic peripheral neuropathy associated with type 2 diabetes mellitus ([place])     No orders of the  defined types were placed in this encounter.   -Patient was evaluated and treated. All patient's and/or POA's questions/concerns answered on today's visit. -Continue foot and shoe inspections daily. Monitor blood glucose per PCP/Endocrinologist's recommendations. -Mycotic toenails 1-5 bilaterally were debrided in length and girth with sterile nail nippers and dremel without incident. -Patient/POA to call should there be question/concern in the interim.   Return in about 3 months (around 11/25/2022).  Marzetta Board, DPM

## 2022-09-07 DIAGNOSIS — Z794 Long term (current) use of insulin: Secondary | ICD-10-CM | POA: Diagnosis not present

## 2022-09-07 DIAGNOSIS — E782 Mixed hyperlipidemia: Secondary | ICD-10-CM | POA: Diagnosis not present

## 2022-09-07 DIAGNOSIS — E1169 Type 2 diabetes mellitus with other specified complication: Secondary | ICD-10-CM | POA: Diagnosis not present

## 2022-09-07 DIAGNOSIS — I10 Essential (primary) hypertension: Secondary | ICD-10-CM | POA: Diagnosis not present

## 2022-09-07 DIAGNOSIS — D508 Other iron deficiency anemias: Secondary | ICD-10-CM | POA: Diagnosis not present

## 2022-09-15 ENCOUNTER — Other Ambulatory Visit: Payer: Self-pay

## 2022-09-24 DIAGNOSIS — E1065 Type 1 diabetes mellitus with hyperglycemia: Secondary | ICD-10-CM | POA: Diagnosis not present

## 2022-09-30 DIAGNOSIS — K900 Celiac disease: Secondary | ICD-10-CM | POA: Diagnosis not present

## 2022-09-30 DIAGNOSIS — E782 Mixed hyperlipidemia: Secondary | ICD-10-CM | POA: Diagnosis not present

## 2022-09-30 DIAGNOSIS — G4733 Obstructive sleep apnea (adult) (pediatric): Secondary | ICD-10-CM | POA: Diagnosis not present

## 2022-09-30 DIAGNOSIS — D508 Other iron deficiency anemias: Secondary | ICD-10-CM | POA: Diagnosis not present

## 2022-09-30 DIAGNOSIS — N182 Chronic kidney disease, stage 2 (mild): Secondary | ICD-10-CM | POA: Diagnosis not present

## 2022-09-30 DIAGNOSIS — E1122 Type 2 diabetes mellitus with diabetic chronic kidney disease: Secondary | ICD-10-CM | POA: Diagnosis not present

## 2022-09-30 DIAGNOSIS — I10 Essential (primary) hypertension: Secondary | ICD-10-CM | POA: Diagnosis not present

## 2022-09-30 DIAGNOSIS — Z794 Long term (current) use of insulin: Secondary | ICD-10-CM | POA: Diagnosis not present

## 2022-10-05 DIAGNOSIS — Z794 Long term (current) use of insulin: Secondary | ICD-10-CM | POA: Diagnosis not present

## 2022-10-05 DIAGNOSIS — E782 Mixed hyperlipidemia: Secondary | ICD-10-CM | POA: Diagnosis not present

## 2022-10-05 DIAGNOSIS — D508 Other iron deficiency anemias: Secondary | ICD-10-CM | POA: Diagnosis not present

## 2022-10-05 DIAGNOSIS — I10 Essential (primary) hypertension: Secondary | ICD-10-CM | POA: Diagnosis not present

## 2022-10-05 DIAGNOSIS — E1169 Type 2 diabetes mellitus with other specified complication: Secondary | ICD-10-CM | POA: Diagnosis not present

## 2022-10-15 DIAGNOSIS — D509 Iron deficiency anemia, unspecified: Secondary | ICD-10-CM | POA: Diagnosis not present

## 2022-10-15 DIAGNOSIS — K9049 Malabsorption due to intolerance, not elsewhere classified: Secondary | ICD-10-CM | POA: Diagnosis not present

## 2022-10-25 DIAGNOSIS — E1065 Type 1 diabetes mellitus with hyperglycemia: Secondary | ICD-10-CM | POA: Diagnosis not present

## 2022-11-05 DIAGNOSIS — G4733 Obstructive sleep apnea (adult) (pediatric): Secondary | ICD-10-CM | POA: Diagnosis not present

## 2022-11-09 ENCOUNTER — Other Ambulatory Visit (HOSPITAL_COMMUNITY): Payer: Self-pay

## 2022-11-09 MED ORDER — MOUNJARO 7.5 MG/0.5ML ~~LOC~~ SOAJ
SUBCUTANEOUS | 2 refills | Status: AC
  Filled 2022-11-09: qty 2, 28d supply, fill #0

## 2022-11-25 DIAGNOSIS — E1065 Type 1 diabetes mellitus with hyperglycemia: Secondary | ICD-10-CM | POA: Diagnosis not present

## 2022-11-30 ENCOUNTER — Other Ambulatory Visit (HOSPITAL_COMMUNITY): Payer: Self-pay

## 2022-11-30 MED ORDER — MOUNJARO 10 MG/0.5ML ~~LOC~~ SOAJ
10.0000 mg | SUBCUTANEOUS | 2 refills | Status: AC
  Filled 2022-11-30: qty 2, 28d supply, fill #0

## 2022-12-21 ENCOUNTER — Encounter: Payer: Self-pay | Admitting: Podiatry

## 2022-12-21 ENCOUNTER — Ambulatory Visit: Payer: Medicare Other | Admitting: Podiatry

## 2022-12-21 VITALS — BP 139/71

## 2022-12-21 DIAGNOSIS — B351 Tinea unguium: Secondary | ICD-10-CM

## 2022-12-21 DIAGNOSIS — I739 Peripheral vascular disease, unspecified: Secondary | ICD-10-CM | POA: Diagnosis not present

## 2022-12-21 DIAGNOSIS — R635 Abnormal weight gain: Secondary | ICD-10-CM | POA: Insufficient documentation

## 2022-12-21 DIAGNOSIS — M79674 Pain in right toe(s): Secondary | ICD-10-CM

## 2022-12-21 DIAGNOSIS — K5900 Constipation, unspecified: Secondary | ICD-10-CM | POA: Insufficient documentation

## 2022-12-21 DIAGNOSIS — R1310 Dysphagia, unspecified: Secondary | ICD-10-CM | POA: Insufficient documentation

## 2022-12-21 DIAGNOSIS — E1142 Type 2 diabetes mellitus with diabetic polyneuropathy: Secondary | ICD-10-CM

## 2022-12-21 DIAGNOSIS — M79675 Pain in left toe(s): Secondary | ICD-10-CM

## 2022-12-21 NOTE — Progress Notes (Unsigned)
  Subjective:  Patient ID: Sarah House, female    DOB: 14-Mar-1956,  MRN: [id number]  Sarah House presents to clinic today for {jgcomplaint:23593}  Chief Complaint  Patient presents with   Nail Problem    DFC BS-153 A1C-8.1 PCP-Ramachandran PCP VST- 3 months ago   New problem(s): None. {jgcomplaint:23593}  PCP is [name], MD.  No Known Allergies  Review of Systems: Negative except as noted in the HPI.  Objective: No changes noted in today's physical examination. Vitals:   12/21/22 1010  BP: 139/71   Sarah House is a pleasant 67 y.o. female {jgbodyhabitus:24098} AAO x 3.  Vascular Examination: CFT <3 seconds b/l LE. Palpable DP pulse(s) b/l LE. Nonpalpable PT pulse(s) b/l LE. Trace edema noted BLE. No cyanosis or clubbing noted b/l LE.  Dermatological Examination: Pedal skin is warm and supple b/l LE. No open wounds b/l LE. No interdigital macerations noted b/l LE. Toenails 1-5 b/l elongated, discolored, dystrophic, thickened, crumbly with subungual debris and tenderness to dorsal palpation. No hyperkeratotic nor porokeratotic lesions present on today's visit.  Neurological Examination: Pt has subjective symptoms of neuropathy. Protective sensation intact 5/5 intact bilaterally with 10g monofilament b/l. Vibratory sensation intact b/l.  Musculoskeletal Examination: Muscle strength 5/5 to all lower extremity muscle groups bilaterally. Hammertoe(s) noted to the bilateral 2nd toes. Pes planus deformity noted bilateral LE. Utilizes cane for ambulation assistance.  Assessment/Plan: 1. Pain due to onychomycosis of toenails of both feet   2. PAD (peripheral artery disease) ([place])   3. Diabetic peripheral neuropathy associated with type 2 diabetes mellitus ([place])     No orders of the defined types were placed in this encounter.   None {Jgplan:23602::"-Patient/POA to call should there be question/concern in the interim."}   Return in about 3 months  (around 03/21/2023).  Marzetta Board, DPM

## 2022-12-23 DIAGNOSIS — I10 Essential (primary) hypertension: Secondary | ICD-10-CM | POA: Diagnosis not present

## 2022-12-23 DIAGNOSIS — E1165 Type 2 diabetes mellitus with hyperglycemia: Secondary | ICD-10-CM | POA: Diagnosis not present

## 2022-12-24 DIAGNOSIS — E1065 Type 1 diabetes mellitus with hyperglycemia: Secondary | ICD-10-CM | POA: Diagnosis not present

## 2022-12-28 ENCOUNTER — Other Ambulatory Visit (HOSPITAL_COMMUNITY): Payer: Self-pay

## 2022-12-28 DIAGNOSIS — D500 Iron deficiency anemia secondary to blood loss (chronic): Secondary | ICD-10-CM | POA: Diagnosis not present

## 2022-12-28 DIAGNOSIS — Z794 Long term (current) use of insulin: Secondary | ICD-10-CM | POA: Diagnosis not present

## 2022-12-28 DIAGNOSIS — I10 Essential (primary) hypertension: Secondary | ICD-10-CM | POA: Diagnosis not present

## 2022-12-28 DIAGNOSIS — E1169 Type 2 diabetes mellitus with other specified complication: Secondary | ICD-10-CM | POA: Diagnosis not present

## 2022-12-28 DIAGNOSIS — E782 Mixed hyperlipidemia: Secondary | ICD-10-CM | POA: Diagnosis not present

## 2022-12-28 MED ORDER — MOUNJARO 10 MG/0.5ML ~~LOC~~ SOAJ
10.0000 mg | SUBCUTANEOUS | 1 refills | Status: DC
  Filled 2022-12-28: qty 2, 28d supply, fill #0
  Filled 2023-01-26: qty 2, 28d supply, fill #1

## 2022-12-30 DIAGNOSIS — E1122 Type 2 diabetes mellitus with diabetic chronic kidney disease: Secondary | ICD-10-CM | POA: Diagnosis not present

## 2022-12-30 DIAGNOSIS — E1169 Type 2 diabetes mellitus with other specified complication: Secondary | ICD-10-CM | POA: Diagnosis not present

## 2022-12-30 DIAGNOSIS — N182 Chronic kidney disease, stage 2 (mild): Secondary | ICD-10-CM | POA: Diagnosis not present

## 2022-12-30 DIAGNOSIS — E782 Mixed hyperlipidemia: Secondary | ICD-10-CM | POA: Diagnosis not present

## 2022-12-30 DIAGNOSIS — Z794 Long term (current) use of insulin: Secondary | ICD-10-CM | POA: Diagnosis not present

## 2023-01-06 DIAGNOSIS — E1065 Type 1 diabetes mellitus with hyperglycemia: Secondary | ICD-10-CM | POA: Diagnosis not present

## 2023-01-06 DIAGNOSIS — D508 Other iron deficiency anemias: Secondary | ICD-10-CM | POA: Diagnosis not present

## 2023-01-06 DIAGNOSIS — H5213 Myopia, bilateral: Secondary | ICD-10-CM | POA: Diagnosis not present

## 2023-01-06 DIAGNOSIS — H40059 Ocular hypertension, unspecified eye: Secondary | ICD-10-CM | POA: Diagnosis not present

## 2023-01-06 DIAGNOSIS — E119 Type 2 diabetes mellitus without complications: Secondary | ICD-10-CM | POA: Diagnosis not present

## 2023-01-06 DIAGNOSIS — Z794 Long term (current) use of insulin: Secondary | ICD-10-CM | POA: Diagnosis not present

## 2023-01-06 DIAGNOSIS — G4733 Obstructive sleep apnea (adult) (pediatric): Secondary | ICD-10-CM | POA: Diagnosis not present

## 2023-01-06 DIAGNOSIS — E782 Mixed hyperlipidemia: Secondary | ICD-10-CM | POA: Diagnosis not present

## 2023-01-06 DIAGNOSIS — K900 Celiac disease: Secondary | ICD-10-CM | POA: Diagnosis not present

## 2023-01-06 DIAGNOSIS — I10 Essential (primary) hypertension: Secondary | ICD-10-CM | POA: Diagnosis not present

## 2023-01-06 DIAGNOSIS — H2513 Age-related nuclear cataract, bilateral: Secondary | ICD-10-CM | POA: Diagnosis not present

## 2023-01-24 DIAGNOSIS — E1065 Type 1 diabetes mellitus with hyperglycemia: Secondary | ICD-10-CM | POA: Diagnosis not present

## 2023-02-03 DIAGNOSIS — G4733 Obstructive sleep apnea (adult) (pediatric): Secondary | ICD-10-CM | POA: Diagnosis not present

## 2023-02-23 DIAGNOSIS — E1065 Type 1 diabetes mellitus with hyperglycemia: Secondary | ICD-10-CM | POA: Diagnosis not present

## 2023-03-07 ENCOUNTER — Other Ambulatory Visit (HOSPITAL_COMMUNITY): Payer: Self-pay

## 2023-03-07 MED ORDER — MOUNJARO 10 MG/0.5ML ~~LOC~~ SOAJ
SUBCUTANEOUS | 6 refills | Status: AC
  Filled 2023-03-07: qty 2, 28d supply, fill #0

## 2023-03-14 ENCOUNTER — Other Ambulatory Visit (HOSPITAL_COMMUNITY): Payer: Self-pay

## 2023-03-15 ENCOUNTER — Other Ambulatory Visit (HOSPITAL_COMMUNITY): Payer: Self-pay

## 2023-03-26 DIAGNOSIS — E1065 Type 1 diabetes mellitus with hyperglycemia: Secondary | ICD-10-CM | POA: Diagnosis not present

## 2023-03-29 DIAGNOSIS — I10 Essential (primary) hypertension: Secondary | ICD-10-CM | POA: Diagnosis not present

## 2023-03-29 DIAGNOSIS — E1065 Type 1 diabetes mellitus with hyperglycemia: Secondary | ICD-10-CM | POA: Diagnosis not present

## 2023-03-29 DIAGNOSIS — Z794 Long term (current) use of insulin: Secondary | ICD-10-CM | POA: Diagnosis not present

## 2023-03-29 DIAGNOSIS — E1169 Type 2 diabetes mellitus with other specified complication: Secondary | ICD-10-CM | POA: Diagnosis not present

## 2023-03-29 DIAGNOSIS — E782 Mixed hyperlipidemia: Secondary | ICD-10-CM | POA: Diagnosis not present

## 2023-04-19 ENCOUNTER — Ambulatory Visit: Payer: Medicare Other | Admitting: Podiatry

## 2023-04-19 ENCOUNTER — Encounter: Payer: Self-pay | Admitting: Podiatry

## 2023-04-19 VITALS — BP 138/70 | HR 77

## 2023-04-19 DIAGNOSIS — E1142 Type 2 diabetes mellitus with diabetic polyneuropathy: Secondary | ICD-10-CM

## 2023-04-19 DIAGNOSIS — M79674 Pain in right toe(s): Secondary | ICD-10-CM

## 2023-04-19 DIAGNOSIS — B351 Tinea unguium: Secondary | ICD-10-CM

## 2023-04-19 DIAGNOSIS — M2042 Other hammer toe(s) (acquired), left foot: Secondary | ICD-10-CM

## 2023-04-19 DIAGNOSIS — M2142 Flat foot [pes planus] (acquired), left foot: Secondary | ICD-10-CM | POA: Diagnosis not present

## 2023-04-19 DIAGNOSIS — I739 Peripheral vascular disease, unspecified: Secondary | ICD-10-CM

## 2023-04-19 DIAGNOSIS — M2041 Other hammer toe(s) (acquired), right foot: Secondary | ICD-10-CM

## 2023-04-19 DIAGNOSIS — M79675 Pain in left toe(s): Secondary | ICD-10-CM

## 2023-04-19 DIAGNOSIS — M2141 Flat foot [pes planus] (acquired), right foot: Secondary | ICD-10-CM | POA: Diagnosis not present

## 2023-04-19 NOTE — Progress Notes (Signed)
  Subjective:  Patient ID: Sarah House, female    DOB: 1955/12/15,  MRN: [id number]  Sarah House presents to clinic today for at risk footcare. Patient has h/o diabetes, neuropathy and PAD and is seen for  and painful thick toenails that are difficult to trim. Pain interferes with ambulation. Aggravating factors include wearing enclosed shoe gear. Pain is relieved with periodic professional debridement. Patient is requesting diabetic shoes on today's visit. Chief Complaint  Patient presents with   Diabetes    "She cuts my toenails."  Dr. Nicholos Johns - 12/2022, glucose - 154 mg/dl   New problem(s): None.   PCP is Georgianne Fick, MD.  No Known Allergies  Review of Systems: Negative except as noted in the HPI.  Objective: No changes noted in today's physical examination. Vitals:   04/19/23 1043  BP: 138/70  Pulse: 77   Sarah House is a pleasant 67 y.o. female morbidly obese in NAD. AAO x 3.  Vascular Examination: CFT <3 seconds b/l LE. Palpable DP pulse(s) b/l LE. Nonpalpable PT pulse(s) b/l LE. Trace edema noted BLE. No cyanosis or clubbing noted b/l LE.  Dermatological Examination: Pedal skin is warm and supple b/l LE. No open wounds b/l LE. No interdigital macerations noted b/l LE. Toenails 1-5 b/l elongated, discolored, dystrophic, thickened, crumbly with subungual debris and tenderness to dorsal palpation. No hyperkeratotic nor porokeratotic lesions present on today's visit.  Neurological Examination: Pt has subjective symptoms of neuropathy. Protective sensation intact 5/5 intact bilaterally with 10g monofilament b/l. Vibratory sensation intact b/l.  Musculoskeletal Examination: Muscle strength 5/5 to all lower extremity muscle groups bilaterally. Hammertoe(s) noted to the bilateral 2nd toes. Pes planus deformity noted bilateral LE. Utilizes cane for ambulation assistance.  Assessment/Plan: 1. Pain due to onychomycosis of toenails of both feet   2. Pes  planus of both feet   3. Diabetic peripheral neuropathy associated with type 2 diabetes mellitus (HCC)   4. Acquired hammertoes of both feet   5. PAD (peripheral artery disease) (HCC)     Orders Placed This Encounter  Procedures   For home use only DME Other see comment    Dispense one pair extra depth shoes and 3 pair total contact insoles.    Order Specific Question:   Length of Need    Answer:   6 Months     FOR HOME USE ONLY DME OTHER SEE COMMENT -Consent given for treatment as described below: -Examined patient. -Continue foot and shoe inspections daily. Monitor blood glucose per PCP/Endocrinologist's recommendations. -Continue supportive shoe gear daily. -Discussed diabetic shoe benefit available based on patient's diagnoses. Patient/POA would like to proceed. Order entered for one pair extra depth shoes and 3 pair total contact insoles. Patient qualifies based on diagnoses. -Toenails 1-5 b/l were debrided in length and girth with sterile nail nippers and dremel without iatrogenic bleeding.  -Patient/POA to call should there be question/concern in the interim.   Return in about 4 months (around 08/19/2023).  Freddie Breech, DPM

## 2023-04-25 DIAGNOSIS — E1065 Type 1 diabetes mellitus with hyperglycemia: Secondary | ICD-10-CM | POA: Diagnosis not present

## 2023-05-04 DIAGNOSIS — G4733 Obstructive sleep apnea (adult) (pediatric): Secondary | ICD-10-CM | POA: Diagnosis not present

## 2023-05-05 DIAGNOSIS — D500 Iron deficiency anemia secondary to blood loss (chronic): Secondary | ICD-10-CM | POA: Diagnosis not present

## 2023-05-05 DIAGNOSIS — I10 Essential (primary) hypertension: Secondary | ICD-10-CM | POA: Diagnosis not present

## 2023-05-05 DIAGNOSIS — E782 Mixed hyperlipidemia: Secondary | ICD-10-CM | POA: Diagnosis not present

## 2023-05-05 DIAGNOSIS — E1169 Type 2 diabetes mellitus with other specified complication: Secondary | ICD-10-CM | POA: Diagnosis not present

## 2023-05-09 ENCOUNTER — Ambulatory Visit (INDEPENDENT_AMBULATORY_CARE_PROVIDER_SITE_OTHER): Payer: Medicare Other | Admitting: Podiatry

## 2023-05-09 DIAGNOSIS — M2041 Other hammer toe(s) (acquired), right foot: Secondary | ICD-10-CM

## 2023-05-09 DIAGNOSIS — M2141 Flat foot [pes planus] (acquired), right foot: Secondary | ICD-10-CM

## 2023-05-09 DIAGNOSIS — E1142 Type 2 diabetes mellitus with diabetic polyneuropathy: Secondary | ICD-10-CM

## 2023-05-09 DIAGNOSIS — I739 Peripheral vascular disease, unspecified: Secondary | ICD-10-CM

## 2023-05-09 DIAGNOSIS — M2142 Flat foot [pes planus] (acquired), left foot: Secondary | ICD-10-CM

## 2023-05-09 DIAGNOSIS — M2042 Other hammer toe(s) (acquired), left foot: Secondary | ICD-10-CM

## 2023-05-09 NOTE — Progress Notes (Signed)
Patient presents today to measured for  diabetic shoes and insoles.  Patient was measured for 1 pair of diabetic shoes and 3 pairs of foam casted diabetic insoles.   Ht 5'7 Wt 264 Shoe size 12w Shoe type  825  Treat physician Georgianne Fick  Financial form signed   Re-appointment for regularly scheduled diabetic foot care visits or if they should experience any trouble with the shoes or insoles.

## 2023-05-12 DIAGNOSIS — I10 Essential (primary) hypertension: Secondary | ICD-10-CM | POA: Diagnosis not present

## 2023-05-12 DIAGNOSIS — M170 Bilateral primary osteoarthritis of knee: Secondary | ICD-10-CM | POA: Diagnosis not present

## 2023-05-12 DIAGNOSIS — D508 Other iron deficiency anemias: Secondary | ICD-10-CM | POA: Diagnosis not present

## 2023-05-12 DIAGNOSIS — Z794 Long term (current) use of insulin: Secondary | ICD-10-CM | POA: Diagnosis not present

## 2023-05-12 DIAGNOSIS — E1122 Type 2 diabetes mellitus with diabetic chronic kidney disease: Secondary | ICD-10-CM | POA: Diagnosis not present

## 2023-05-12 DIAGNOSIS — K900 Celiac disease: Secondary | ICD-10-CM | POA: Diagnosis not present

## 2023-05-12 DIAGNOSIS — G4733 Obstructive sleep apnea (adult) (pediatric): Secondary | ICD-10-CM | POA: Diagnosis not present

## 2023-05-12 DIAGNOSIS — E782 Mixed hyperlipidemia: Secondary | ICD-10-CM | POA: Diagnosis not present

## 2023-05-12 DIAGNOSIS — Z0000 Encounter for general adult medical examination without abnormal findings: Secondary | ICD-10-CM | POA: Diagnosis not present

## 2023-05-23 ENCOUNTER — Other Ambulatory Visit (HOSPITAL_COMMUNITY): Payer: Self-pay

## 2023-05-23 MED ORDER — MOUNJARO 12.5 MG/0.5ML ~~LOC~~ SOAJ
12.5000 mg | SUBCUTANEOUS | 5 refills | Status: AC
  Filled 2023-05-23: qty 2, 28d supply, fill #0
  Filled 2023-06-17: qty 2, 28d supply, fill #1
  Filled 2023-07-18: qty 2, 28d supply, fill #2
  Filled 2023-10-28: qty 2, 28d supply, fill #3
  Filled 2023-12-13: qty 2, 28d supply, fill #4
  Filled 2024-01-23: qty 2, 28d supply, fill #5

## 2023-05-26 DIAGNOSIS — E1065 Type 1 diabetes mellitus with hyperglycemia: Secondary | ICD-10-CM | POA: Diagnosis not present

## 2023-06-06 DIAGNOSIS — Z1231 Encounter for screening mammogram for malignant neoplasm of breast: Secondary | ICD-10-CM | POA: Diagnosis not present

## 2023-06-09 ENCOUNTER — Ambulatory Visit (INDEPENDENT_AMBULATORY_CARE_PROVIDER_SITE_OTHER): Payer: Medicare Other

## 2023-06-09 DIAGNOSIS — M2041 Other hammer toe(s) (acquired), right foot: Secondary | ICD-10-CM | POA: Diagnosis not present

## 2023-06-09 DIAGNOSIS — I739 Peripheral vascular disease, unspecified: Secondary | ICD-10-CM

## 2023-06-09 DIAGNOSIS — M2042 Other hammer toe(s) (acquired), left foot: Secondary | ICD-10-CM | POA: Diagnosis not present

## 2023-06-09 DIAGNOSIS — M2141 Flat foot [pes planus] (acquired), right foot: Secondary | ICD-10-CM | POA: Diagnosis not present

## 2023-06-09 DIAGNOSIS — M2142 Flat foot [pes planus] (acquired), left foot: Secondary | ICD-10-CM | POA: Diagnosis not present

## 2023-06-09 DIAGNOSIS — E1142 Type 2 diabetes mellitus with diabetic polyneuropathy: Secondary | ICD-10-CM | POA: Diagnosis not present

## 2023-06-09 NOTE — Progress Notes (Signed)
,  Patient presents today to pick up diabetic shoes and insoles.  Patient was dispensed 1 pair of diabetic shoes and 3 pairs of foam casted diabetic insoles. Fit was satisfactory. Instructions for break-in and wear was reviewed and a copy was given to the patient.   Re-appointment for regularly scheduled diabetic foot care visits or if they should experience any trouble with the shoes or insoles.  Patient was very happy with fit will call office if any problems arise   Addison Bailey CPed, CFo, CFm

## 2023-06-17 ENCOUNTER — Other Ambulatory Visit (HOSPITAL_COMMUNITY): Payer: Self-pay

## 2023-06-20 ENCOUNTER — Encounter: Payer: Self-pay | Admitting: *Deleted

## 2023-06-21 ENCOUNTER — Telehealth: Payer: Medicare Other | Admitting: Adult Health

## 2023-06-21 DIAGNOSIS — G4733 Obstructive sleep apnea (adult) (pediatric): Secondary | ICD-10-CM

## 2023-06-22 ENCOUNTER — Telehealth: Payer: Self-pay | Admitting: *Deleted

## 2023-06-22 NOTE — Telephone Encounter (Signed)
Autopap pressure change order sent to [employer].

## 2023-06-23 NOTE — Telephone Encounter (Signed)
[employer] confirmed receipt of order.  

## 2023-07-18 ENCOUNTER — Other Ambulatory Visit (HOSPITAL_COMMUNITY): Payer: Self-pay

## 2023-07-19 ENCOUNTER — Other Ambulatory Visit (HOSPITAL_COMMUNITY): Payer: Self-pay

## 2023-08-11 DIAGNOSIS — E782 Mixed hyperlipidemia: Secondary | ICD-10-CM | POA: Diagnosis not present

## 2023-08-11 DIAGNOSIS — I10 Essential (primary) hypertension: Secondary | ICD-10-CM | POA: Diagnosis not present

## 2023-08-11 DIAGNOSIS — E1169 Type 2 diabetes mellitus with other specified complication: Secondary | ICD-10-CM | POA: Diagnosis not present

## 2023-08-11 DIAGNOSIS — Z794 Long term (current) use of insulin: Secondary | ICD-10-CM | POA: Diagnosis not present

## 2023-08-12 ENCOUNTER — Other Ambulatory Visit (HOSPITAL_BASED_OUTPATIENT_CLINIC_OR_DEPARTMENT_OTHER): Payer: Self-pay | Admitting: Internal Medicine

## 2023-08-12 DIAGNOSIS — E782 Mixed hyperlipidemia: Secondary | ICD-10-CM

## 2023-08-17 ENCOUNTER — Ambulatory Visit: Payer: Medicare Other | Admitting: Podiatry

## 2023-08-17 ENCOUNTER — Encounter: Payer: Self-pay | Admitting: Podiatry

## 2023-08-17 DIAGNOSIS — M2142 Flat foot [pes planus] (acquired), left foot: Secondary | ICD-10-CM | POA: Diagnosis not present

## 2023-08-17 DIAGNOSIS — M2041 Other hammer toe(s) (acquired), right foot: Secondary | ICD-10-CM

## 2023-08-17 DIAGNOSIS — B351 Tinea unguium: Secondary | ICD-10-CM

## 2023-08-17 DIAGNOSIS — M79674 Pain in right toe(s): Secondary | ICD-10-CM

## 2023-08-17 DIAGNOSIS — E119 Type 2 diabetes mellitus without complications: Secondary | ICD-10-CM | POA: Diagnosis not present

## 2023-08-17 DIAGNOSIS — M2042 Other hammer toe(s) (acquired), left foot: Secondary | ICD-10-CM

## 2023-08-17 DIAGNOSIS — M79675 Pain in left toe(s): Secondary | ICD-10-CM | POA: Diagnosis not present

## 2023-08-17 DIAGNOSIS — M2141 Flat foot [pes planus] (acquired), right foot: Secondary | ICD-10-CM

## 2023-08-17 NOTE — Progress Notes (Signed)
ANNUAL DIABETIC FOOT EXAM  Subjective: Ranae Palms presents today annual diabetic foot exam.  Chief Complaint  Patient presents with   [facility]    [facility] A1C-Unsure  BS-135 NEXT PCP APPT-10/2023   Patient confirms h/o diabetes.  Patient denies any h/o foot wounds.  Patient has been diagnosed with neuropathy.  Georgianne Fick, MD is patient's PCP.  Past Medical History:  Diagnosis Date   Anemia    Arthritis    knees   Diabetes mellitus without complication (HCC)    GERD (gastroesophageal reflux disease)    Hypercholesterolemia    Hypertension    Hyperthyroidism    had "radiation to kill it"   Patient Active Problem List   Diagnosis Date Noted   Abnormal weight gain 12/21/2022   Constipation 12/21/2022   Dysphagia 12/21/2022   Long term (current) use of insulin (HCC) 02/12/2022   Type 1 diabetes mellitus with diabetic polyneuropathy (HCC) 02/12/2022   Arthritis of both knees 01/30/2021   Celiac disease 01/30/2021   Encounter for general adult medical examination without abnormal findings 01/30/2021   Essential hypertension 01/30/2021   Gastroesophageal reflux disease 01/30/2021   Iron deficiency anemia due to chronic blood loss 01/30/2021   Migraine 01/30/2021   Mixed hyperlipidemia 01/30/2021   Morbid obesity (HCC) 01/30/2021   Posterior reversible encephalopathy syndrome 01/30/2021   Thyrotoxicosis 01/30/2021   Type 1 diabetes mellitus with hyperglycemia (HCC) 01/30/2021   Obstructive sleep apnea treated with continuous positive airway pressure (CPAP) 01/04/2019   Uncontrolled type 2 diabetes mellitus with hyperglycemia (HCC) 03/24/2018   Past Surgical History:  Procedure Laterality Date   BREAST LUMPECTOMY WITH RADIOACTIVE SEED LOCALIZATION Right 12/23/2015   Procedure: RIGHT BREAST LUMPECTOMY WITH RADIOACTIVE SEED LOCALIZATION;  Surgeon: Abigail Miyamoto, MD;  Location: [facility];  Service: General;  Laterality: Right;   COLONOSCOPY W/  POLYPECTOMY     COLONOSCOPY WITH PROPOFOL N/A 04/09/2016   Procedure: COLONOSCOPY WITH PROPOFOL;  Surgeon: Jeani Hawking, MD;  Location: [facility];  Service: Endoscopy;  Laterality: N/A;   ESOPHAGOGASTRODUODENOSCOPY (EGD) WITH PROPOFOL N/A 04/09/2016   Procedure: ESOPHAGOGASTRODUODENOSCOPY (EGD) WITH PROPOFOL;  Surgeon: Jeani Hawking, MD;  Location: [facility];  Service: Endoscopy;  Laterality: N/A;   GIVENS CAPSULE STUDY N/A 06/21/2016   Procedure: GIVENS CAPSULE STUDY;  Surgeon: Charna Elizabeth, MD;  Location: [facility] ENDOSCOPY;  Service: Endoscopy;  Laterality: N/A;   KNEE ARTHROSCOPY     Current Outpatient Medications on File Prior to Visit  Medication Sig Dispense Refill   ACCU-CHEK GUIDE test strip TAKE AS DIRECTED TWICE A DAY     amLODipine (NORVASC) 5 MG tablet Take 5 mg by mouth daily.     amoxicillin (AMOXIL) 500 MG capsule Take 500 mg by mouth 3 (three) times daily.     amoxicillin-clavulanate (AUGMENTIN) 875-125 MG tablet Take 1 tablet by mouth 2 (two) times daily.     aspirin (EC-81 ASPIRIN) 81 MG EC tablet 1 tablet     aspirin EC 81 MG tablet Take 81 mg by mouth daily.     atorvastatin (LIPITOR) 20 MG tablet Take 20 mg by mouth daily.     atorvastatin (LIPITOR) 40 MG tablet Take 80 mg by mouth daily.     azithromycin (ZITHROMAX) 250 MG tablet TAKE 2 TABLETS BY MOUTH ON DAY 1, THEN TAKE 1 TABLET DAILY ON DAYS 2-5     Blood Glucose Monitoring Suppl (ACCU-CHEK GUIDE) w/Device KIT See admin instructions.     calcium carbonate (CALCIUM 600) 1500 (600 Ca) MG  TABS tablet 1 tablet with meals     Calcium Carbonate-Vitamin D (CALCIUM 500 + D PO) Take 1 tablet by mouth daily.     cetirizine (ZYRTEC ALLERGY) 10 MG tablet 1 tablet as needed     chlorhexidine (PERIDEX) 0.12 % solution SMARTSIG:15 Milliliter(s) By Mouth 3 Times Daily     clotrimazole-betamethasone (LOTRISONE) cream 1 application to affected area     cyclobenzaprine (FLEXERIL) 10 MG tablet Take 10 mg by mouth 3 (three) times daily as  needed for muscle spasms.      fluconazole (DIFLUCAN) 150 MG tablet Take 150 mg by mouth once.     Fluticasone-Umeclidin-Vilant (TRELEGY ELLIPTA IN) 1 puff     glimepiride (AMARYL) 4 MG tablet Take 8 mg by mouth daily with breakfast.     Glucagon (GVOKE HYPOPEN 2-PACK) 1 MG/0.2ML SOAJ See admin instructions.     glucose blood test strip 3 (three) times daily.     glucose blood test strip See admin instructions.     glucose blood test strip See admin instructions.     hydrochlorothiazide (HYDRODIURIL) 25 MG tablet      HYDROcodone-acetaminophen (NORCO/VICODIN) 5-325 MG tablet Take 1 tablet by mouth every 6 (six) hours as needed.     Insulin Degludec (TRESIBA FLEXTOUCH) 200 UNIT/ML SOPN Inject 36 Units into the skin at bedtime.      insulin lispro (HUMALOG KWIKPEN) 100 UNIT/ML KiwkPen Inject 6 Units into the skin.      insulin lispro (HUMALOG) 100 UNIT/ML KwikPen SMARTSIG:6 Unit(s) SUB-Q Daily     Insulin Pen Needle (B-D ULTRAFINE III SHORT PEN) 31G X 8 MM MISC See admin instructions.     Iron-FA-B Cmp-C-Biot-Probiotic (FUSION PLUS) CAPS Take 1 capsule by mouth daily.     ketoconazole (NIZORAL) 2 % cream 1 application to affected area     losartan-hydrochlorothiazide (HYZAAR) 100-25 MG per tablet Take 1 tablet by mouth daily.     meloxicam (MOBIC) 15 MG tablet Take 15 mg by mouth daily as needed for pain.      meloxicam (MOBIC) 15 MG tablet 1 tablet     metFORMIN (GLUCOPHAGE-XR) 500 MG 24 hr tablet 2 tablets     metFORMIN (GLUCOPHAGE-XR) 500 MG 24 hr tablet Take 2 tablets by mouth 2 (two) times daily.     METROGEL 1 % gel Apply 1 application topically daily as needed (Apply to affected area.).      metroNIDAZOLE (FLAGYL) 500 MG tablet Take 500 mg by mouth 3 (three) times daily.     nystatin ointment (MYCOSTATIN) Apply topically.     olmesartan (BENICAR) 40 MG tablet      omeprazole (PRILOSEC) 20 MG capsule 1 capsule     ondansetron (ZOFRAN) 4 MG tablet Take 4 mg by mouth every 8 (eight) hours  as needed.     tirzepatide (MOUNJARO) 10 MG/0.5ML Pen Inject 10 mg into the skin once a week. 2 mL 2   tirzepatide (MOUNJARO) 10 MG/0.5ML Pen Inject 10 mg into the skin once a week. 2 mL 6   tirzepatide (MOUNJARO) 12.5 MG/0.5ML Pen Inject 12.5 mg into the skin once a week. 2 mL 5   tirzepatide (MOUNJARO) 7.5 MG/0.5ML Pen Inject 7.5 mg Subcutaneously once a week 2 mL 2   tiZANidine (ZANAFLEX) 4 MG tablet 1 tablet as needed for muscle spasm     tiZANidine (ZANAFLEX) 4 MG tablet 1 tablet as needed for muscle spasm     traMADol (ULTRAM) 50 MG tablet 1 tablet as needed  for pain     traMADol (ULTRAM) 50 MG tablet 1 tablet as needed for pain     TRELEGY ELLIPTA 100-62.5-25 MCG/INH AEPB Inhale 1 puff into the lungs daily.     triamcinolone (NASACORT ALLERGY 24HR CHILDREN) 55 MCG/ACT AERO nasal inhaler 1 spray in each nostril     No current facility-administered medications on file prior to visit.    No Known Allergies Social History   Occupational History    Comment: Primary school teacher  Tobacco Use   Smoking status: Never   Smokeless tobacco: Never  Substance and Sexual Activity   Alcohol use: No    Alcohol/week: 0.0 standard drinks of alcohol   Drug use: No   Sexual activity: Not on file   Family History  Problem Relation Age of Onset   Diabetes Mother    Lymphoma Father    Leukemia Maternal Uncle    Cancer Paternal Grandfather        stomach   Heart disease Sister    Immunization History  Administered Date(s) Administered   Influenza Inj Mdck Quad Pf 09/19/2017   Influenza,inj,Quad PF,6+ Mos 07/19/2018     Review of Systems: Negative except as noted in the HPI.   Objective: There were no vitals filed for this visit.  [place] MYKAILAH ROTUNDO is a pleasant 67 y.o. female in NAD. AAO X 3.  Title   Diabetic Foot Exam - detailed Date & Time: 08/17/2023 11:00 AM Diabetic Foot exam was performed with the following findings: Yes  Visual Foot Exam completed.: Yes  Is there a  history of foot ulcer?: No Is there a foot ulcer now?: No Is there swelling?: Yes Is there elevated skin temperature?: No Is there abnormal foot shape?: Yes (Comment: Pes planus b/l) Is there a claw toe deformity?: No Are the toenails long?: Yes Are the toenails thick?: Yes Are the toenails ingrown?: No Is the skin thin, fragile, shiny and hairless?": No Normal Range of Motion?: Yes Is there foot or ankle muscle weakness?: No Do you have pain in calf while walking?: No Are the shoes appropriate in style and fit?: Yes Can the patient see the bottom of their feet?: No Pulse Foot Exam completed.: Yes   Right Posterior Tibialis: Present Left posterior Tibialis: Present   Right Dorsalis Pedis: Present Left Dorsalis Pedis: Present     Sensory Foot Exam Completed.: Yes Semmes-Weinstein Monofilament Test "+" means "has sensation" and "-" means "no sensation"  R Foot Test Control: Pos L Foot Test Control: Pos   R Site 1-Great Toe: Pos L Site 1-Great Toe: Pos   R Site 4: Pos L Site 4: Pos   R site 5: Pos L Site 5: Pos  R Site 6: Pos L Site 6: Pos     Image components are not supported.   Image components are not supported. Image components are not supported.  Tuning [place] Right vibratory: present Left vibratory: present  Comments Utilizes cane for ambulation assistance.  Patient has subjective symptoms of neuropathy.     ADA Risk Categorization: Low Risk :  Patient has all of the following: Intact protective sensation No prior foot ulcer  No severe deformity Pedal pulses present  Assessment: 1. Pain due to onychomycosis of toenails of both feet   2. Pes planus of both feet   3. Acquired hammertoes of both feet   4. Controlled type 2 diabetes mellitus without complication, without long-term current use of insulin (HCC)   5. Encounter for diabetic  foot exam [facility])     Plan: -Patient was evaluated today. All questions/concerns addressed on today's visit. -Diabetic foot  examination performed today. -Continue foot and shoe inspections daily. Monitor blood glucose per PCP/Endocrinologist's recommendations. -Patient to continue soft, supportive shoe gear daily. -Mycotic toenails 1-5 bilaterally were debrided in length and girth with sterile nail nippers and dremel without incident. -Patient/POA to call should there be question/concern in the interim. Return in about 3 months (around 11/17/2023).  Freddie Breech, DPM

## 2023-08-18 ENCOUNTER — Ambulatory Visit (HOSPITAL_COMMUNITY)
Admission: RE | Admit: 2023-08-18 | Discharge: 2023-08-18 | Disposition: A | Discharge status: Home / Self Care | Payer: Managed Care, Other (non HMO) | Source: Ambulatory Visit | Attending: Internal Medicine | Admitting: Internal Medicine

## 2023-08-18 DIAGNOSIS — E782 Mixed hyperlipidemia: Secondary | ICD-10-CM | POA: Insufficient documentation

## 2023-08-22 ENCOUNTER — Encounter: Payer: Self-pay | Admitting: Podiatry

## 2023-08-30 DIAGNOSIS — E1169 Type 2 diabetes mellitus with other specified complication: Secondary | ICD-10-CM | POA: Diagnosis not present

## 2023-10-28 ENCOUNTER — Other Ambulatory Visit: Payer: Self-pay

## 2023-10-28 ENCOUNTER — Other Ambulatory Visit (HOSPITAL_COMMUNITY): Payer: Self-pay

## 2023-11-17 ENCOUNTER — Emergency Department (HOSPITAL_BASED_OUTPATIENT_CLINIC_OR_DEPARTMENT_OTHER)
Admission: EM | Admit: 2023-11-17 | Discharge: 2023-11-17 | Disposition: A | Discharge status: Home / Self Care | Payer: Medicare HMO | Attending: Emergency Medicine | Admitting: Emergency Medicine

## 2023-11-17 ENCOUNTER — Other Ambulatory Visit: Payer: Self-pay

## 2023-11-17 ENCOUNTER — Emergency Department (HOSPITAL_BASED_OUTPATIENT_CLINIC_OR_DEPARTMENT_OTHER): Payer: Medicare HMO | Admitting: Radiology

## 2023-11-17 ENCOUNTER — Encounter (HOSPITAL_BASED_OUTPATIENT_CLINIC_OR_DEPARTMENT_OTHER): Payer: Self-pay | Admitting: Emergency Medicine

## 2023-11-17 DIAGNOSIS — Z794 Long term (current) use of insulin: Secondary | ICD-10-CM | POA: Diagnosis not present

## 2023-11-17 DIAGNOSIS — Z79899 Other long term (current) drug therapy: Secondary | ICD-10-CM | POA: Insufficient documentation

## 2023-11-17 DIAGNOSIS — Z7982 Long term (current) use of aspirin: Secondary | ICD-10-CM | POA: Insufficient documentation

## 2023-11-17 DIAGNOSIS — E1122 Type 2 diabetes mellitus with diabetic chronic kidney disease: Secondary | ICD-10-CM | POA: Diagnosis not present

## 2023-11-17 DIAGNOSIS — T18128A Food in esophagus causing other injury, initial encounter: Secondary | ICD-10-CM | POA: Insufficient documentation

## 2023-11-17 DIAGNOSIS — W44F3XA Food entering into or through a natural orifice, initial encounter: Secondary | ICD-10-CM | POA: Insufficient documentation

## 2023-11-17 DIAGNOSIS — D508 Other iron deficiency anemias: Secondary | ICD-10-CM | POA: Diagnosis not present

## 2023-11-17 DIAGNOSIS — Z7984 Long term (current) use of oral hypoglycemic drugs: Secondary | ICD-10-CM | POA: Diagnosis not present

## 2023-11-17 DIAGNOSIS — E782 Mixed hyperlipidemia: Secondary | ICD-10-CM | POA: Diagnosis not present

## 2023-11-17 DIAGNOSIS — Z0389 Encounter for observation for other suspected diseases and conditions ruled out: Secondary | ICD-10-CM | POA: Diagnosis not present

## 2023-11-17 DIAGNOSIS — N182 Chronic kidney disease, stage 2 (mild): Secondary | ICD-10-CM | POA: Diagnosis not present

## 2023-11-17 MED ORDER — GLUCAGON HCL RDNA (DIAGNOSTIC) 1 MG IJ SOLR
1.0000 mg | Freq: Once | INTRAMUSCULAR | Status: AC
  Administered 2023-11-17: 1 mg via INTRAVENOUS
  Filled 2023-11-17: qty 1

## 2023-11-17 MED ORDER — PANTOPRAZOLE SODIUM 40 MG PO TBEC
40.0000 mg | DELAYED_RELEASE_TABLET | Freq: Every day | ORAL | Status: DC
  Administered 2023-11-17: 40 mg via ORAL
  Filled 2023-11-17: qty 1

## 2023-11-17 NOTE — ED Notes (Signed)
Pt given water and coca-cola to fluid challenge. Pt able to tolerate PO medication at this time with coca-cola. Will continue to monitor, EDP aware.

## 2023-11-17 NOTE — Discharge Instructions (Signed)
Please follow-up with your primary care doctor for further evaluation.  May return to the emergency department for any worsening symptoms.

## 2023-11-17 NOTE — ED Provider Notes (Signed)
[place] EMERGENCY DEPARTMENT AT [facility] Provider Note   CSN: [id number] Arrival date & time: 11/17/23  1028     History Chief Complaint  Patient presents with   Food Obstruction    Sarah House is a 68 y.o. female patient who presents to the emergency department today for further evaluation of possible food bolus impaction that started last night after eating some steak.  Since then she has been unable to swallow any solids or liquids without regurgitating the full amount.  She has had this in the past and chart review does reveal she had a similar problem back in 2017 which required an upper endoscopy.  Patient denies any trouble breathing, chest pain, nausea or vomiting.  Also denies fever or chills.  HPI     Home Medications Prior to Admission medications   Medication Sig Start Date End Date Taking? Authorizing Provider  ACCU-CHEK GUIDE test strip TAKE AS DIRECTED TWICE A DAY 04/27/20   [provider]  amLODipine (NORVASC) 5 MG tablet Take 5 mg by mouth daily. 06/05/18   [provider]  aspirin EC 81 MG tablet Take 81 mg by mouth daily.    [provider]  atorvastatin (LIPITOR) 40 MG tablet Take 80 mg by mouth daily.    [provider]  Blood Glucose Monitoring Suppl (ACCU-CHEK GUIDE) w/Device KIT See admin instructions. 07/21/20   [provider]  calcium carbonate (CALCIUM 600) 1500 (600 Ca) MG TABS tablet 1 tablet with meals    [provider]  Calcium Carbonate-Vitamin D (CALCIUM 500 + D PO) Take 1 tablet by mouth daily.    [provider]  cetirizine (ZYRTEC ALLERGY) 10 MG tablet 1 tablet as needed    [provider]  Fluticasone-Umeclidin-Vilant (TRELEGY ELLIPTA IN) 1 puff 01/24/20   [provider]  glimepiride (AMARYL) 4 MG tablet Take 8 mg by mouth daily with breakfast.    [provider]  Glucagon (GVOKE HYPOPEN 2-PACK) 1 MG/0.2ML SOAJ See admin instructions.  02/17/21   [provider]  glucose blood test strip 3 (three) times daily. 04/23/20   [provider]  hydrochlorothiazide (HYDRODIURIL) 25 MG tablet  09/13/18   [provider]  Insulin Degludec (TRESIBA FLEXTOUCH) 200 UNIT/ML SOPN Inject 36 Units into the skin at bedtime.     [provider]  insulin lispro (HUMALOG KWIKPEN) 100 UNIT/ML KiwkPen Inject 6 Units into the skin.     [provider]  Insulin Pen Needle (B-D ULTRAFINE III SHORT PEN) 31G X 8 MM MISC See admin instructions.    [provider]  Iron-FA-B Cmp-C-Biot-Probiotic (FUSION PLUS) CAPS Take 1 capsule by mouth daily. 08/16/22   [provider]  losartan-hydrochlorothiazide (HYZAAR) 100-25 MG per tablet Take 1 tablet by mouth daily.    [provider]  metFORMIN (GLUCOPHAGE-XR) 500 MG 24 hr tablet Take 2 tablets by mouth 2 (two) times daily.    [provider]  METROGEL 1 % gel Apply 1 application topically daily as needed (Apply to affected area.).  02/03/16   [provider]  olmesartan (BENICAR) 40 MG tablet  09/13/18   [provider]  omeprazole (PRILOSEC) 20 MG capsule 1 capsule 02/19/13   [provider]  ondansetron (ZOFRAN) 4 MG tablet Take 4 mg by mouth every 8 (eight) hours as needed. 09/16/21   [provider]  tirzepatide Greggory Keen) 10 MG/0.5ML Pen Inject 10 mg into the skin once a week. Patient not taking: Reported  on 11/17/2023 11/30/22     tirzepatide (MOUNJARO) 10 MG/0.5ML Pen Inject 10 mg into the skin once a week. Patient not taking: Reported on 11/17/2023 03/07/23     tirzepatide (MOUNJARO) 12.5 MG/0.5ML Pen Inject 12.5 mg into the skin once a week. Patient not taking: Reported on 11/17/2023 05/23/23     tirzepatide [facility]) 7.5 MG/0.5ML Pen Inject 7.5 mg Subcutaneously once a week Patient not taking: Reported on 11/17/2023 11/09/22     TRELEGY ELLIPTA 100-62.5-25 MCG/INH AEPB Inhale 1 puff into the lungs  daily. 05/14/20   [provider]  triamcinolone (NASACORT ALLERGY 24HR CHILDREN) 55 MCG/ACT AERO nasal inhaler 1 spray in each nostril 11/29/18   [provider]      Allergies    Patient has no known allergies.    Review of Systems   Review of Systems  All other systems reviewed and are negative.   Physical Exam Updated Vital Signs BP 133/65   Pulse 85   Temp 97.8 F (36.6 C) (Oral)   Resp 16   Wt 113.4 kg   SpO2 95%   BMI 39.16 kg/m  Physical Exam Vitals and nursing note reviewed.  Constitutional:      General: She is not in acute distress.    Appearance: Normal appearance.  HENT:     Head: Normocephalic and atraumatic.  Eyes:     General:        Right eye: No discharge.        Left eye: No discharge.  Cardiovascular:     Comments: Regular rate and rhythm.  S1/S2 are distinct without any evidence of murmur, rubs, or gallops.  Radial pulses are 2+ bilaterally.  Dorsalis pedis pulses are 2+ bilaterally.  No evidence of pedal edema. Pulmonary:     Comments: Clear to auscultation bilaterally.  Normal effort.  No respiratory distress.  No evidence of wheezes, rales, or rhonchi heard throughout. Abdominal:     General: Abdomen is flat. Bowel sounds are normal. There is no distension.     Tenderness: There is no abdominal tenderness. There is no guarding or rebound.  Musculoskeletal:        General: Normal range of motion.     Cervical back: Neck supple.  Skin:    General: Skin is warm and dry.     Findings: No rash.  Neurological:     General: No focal deficit present.     Mental Status: She is alert.  Psychiatric:        Mood and Affect: Mood normal.        Behavior: Behavior normal.     ED Results / Procedures / Treatments   Labs (all labs ordered are listed, but only abnormal results are displayed) Labs Reviewed - No data to display  EKG None  Radiology DG Neck Soft Tissue Result Date: 11/17/2023 CLINICAL DATA:  Food impaction. EXAM:  NECK SOFT TISSUES - 1+ VIEW COMPARISON:  None Available. FINDINGS: There is no evidence of retropharyngeal soft tissue swelling or epiglottic enlargement. The cervical airway is unremarkable and no radio-opaque foreign body identified. IMPRESSION: Negative. Electronically Signed   By: Elgie Collard M.D.   On: 11/17/2023 12:09    Procedures Procedures    Medications Ordered in ED Medications  pantoprazole (PROTONIX) EC tablet 40 mg (40 mg Oral Given 11/17/23 1303)  glucagon (human recombinant) (GLUCAGEN) injection 1 mg (1 mg Intravenous Given 11/17/23 1132)    ED Course/ Medical Decision Making/ A&P Clinical Course as of 11/17/23  1431  Thu Nov 17, 2023  1250 DG Neck Soft Tissue I personally ordered interpreted the study and do not see any evidence of foreign body.  I do agree with the radiologist interpretation. [CF]  1310 Patient was able to take Protonix without choking up the fluid.  She is currently drinking some soda at the bedside.  Will progress to water after.  If she can tolerate both I will likely plan to discharge home. [CF]  1424 Patient able to tolerate water and her antihypertensive medications. [CF]    Clinical Course User Index [CF] Teressa Lower, PA-C   {   Click here for ABCD2, HEART and other calculators  Medical Decision Making RIGHTEOUS BOUCHIE is a 68 y.o. female complaining of food bolus impaction.  Given the location of the sensation will plan to get a soft tissue neck plain film and try glucagon before contacting gastroenterology.  Glucagon was successful.  Patient tolerating p.o. fluids.  Will have her follow-up with her primary care doctor for further evaluation.  Strict return precautions were discussed.  She is safe for discharge.  Amount and/or Complexity of Data Reviewed Radiology: ordered. Decision-making details documented in ED Course.  Risk Prescription drug management.   Final Clinical Impression(s) / ED Diagnoses Final diagnoses:  Food  impaction of esophagus, initial encounter    Rx / DC Orders ED Discharge Orders     None         Jolyn Lent 11/17/23 1431    Linwood Dibbles, MD 11/17/23 1757

## 2023-11-17 NOTE — ED Notes (Signed)
Pt reports she ate some steak last night and feels like some is still stuck in throat. No drooling present, airway intact and breathing WDL; pt reports unable to tolerate PO intake and any attempted intake is unable to go down. Pt reports increased phlegm expectoration. Pt has hx of esophageal stretching.

## 2023-11-17 NOTE — ED Triage Notes (Signed)
Pt reports getting "steak stuck in throat last night". Reports having to spit in cup

## 2023-11-22 ENCOUNTER — Encounter: Payer: Self-pay | Admitting: Podiatry

## 2023-11-22 ENCOUNTER — Ambulatory Visit (INDEPENDENT_AMBULATORY_CARE_PROVIDER_SITE_OTHER): Payer: Medicare HMO | Admitting: Podiatry

## 2023-11-22 DIAGNOSIS — B351 Tinea unguium: Secondary | ICD-10-CM | POA: Diagnosis not present

## 2023-11-22 DIAGNOSIS — M79674 Pain in right toe(s): Secondary | ICD-10-CM | POA: Diagnosis not present

## 2023-11-22 DIAGNOSIS — E119 Type 2 diabetes mellitus without complications: Secondary | ICD-10-CM | POA: Diagnosis not present

## 2023-11-22 DIAGNOSIS — M79675 Pain in left toe(s): Secondary | ICD-10-CM | POA: Diagnosis not present

## 2023-11-24 DIAGNOSIS — G4733 Obstructive sleep apnea (adult) (pediatric): Secondary | ICD-10-CM | POA: Diagnosis not present

## 2023-11-24 DIAGNOSIS — K900 Celiac disease: Secondary | ICD-10-CM | POA: Diagnosis not present

## 2023-11-24 DIAGNOSIS — E782 Mixed hyperlipidemia: Secondary | ICD-10-CM | POA: Diagnosis not present

## 2023-11-24 DIAGNOSIS — D508 Other iron deficiency anemias: Secondary | ICD-10-CM | POA: Diagnosis not present

## 2023-11-24 DIAGNOSIS — E7841 Elevated Lipoprotein(a): Secondary | ICD-10-CM | POA: Diagnosis not present

## 2023-11-24 DIAGNOSIS — N182 Chronic kidney disease, stage 2 (mild): Secondary | ICD-10-CM | POA: Diagnosis not present

## 2023-11-24 DIAGNOSIS — Z794 Long term (current) use of insulin: Secondary | ICD-10-CM | POA: Diagnosis not present

## 2023-11-24 DIAGNOSIS — I10 Essential (primary) hypertension: Secondary | ICD-10-CM | POA: Diagnosis not present

## 2023-11-24 DIAGNOSIS — E1122 Type 2 diabetes mellitus with diabetic chronic kidney disease: Secondary | ICD-10-CM | POA: Diagnosis not present

## 2023-11-24 DIAGNOSIS — M170 Bilateral primary osteoarthritis of knee: Secondary | ICD-10-CM | POA: Diagnosis not present

## 2023-11-25 ENCOUNTER — Other Ambulatory Visit: Payer: Medicare HMO

## 2023-11-25 NOTE — Progress Notes (Signed)
  Subjective:  Patient ID: Sarah House, female    DOB: 03-02-1956,  MRN: [id number]  Sarah House presents to clinic today for: preventative diabetic foot care and painful mycotic toenails x 10 which interfere with daily activities. Pain is relieved with periodic professional debridement.  Chief Complaint  Patient presents with   Sarah House    She is here for diabetic nail trim, Last A1C was 8.2, PCP Dr. Elvera Lennox, and last ov was 10/24/    PCP is Georgianne Fick, MD.  No Known Allergies  Review of Systems: Negative except as noted in the HPI.  Objective: No changes noted in today's physical examination. There were no vitals filed for this visit.  Sarah House is a pleasant 68 y.o. female obese in NAD. AAO x 3.  Vascular Examination: Capillary refill time <3 seconds b/l LE. Palpable pedal pulses b/l LE. Digital hair present b/l. No pedal edema b/l. Skin temperature gradient WNL b/l. No varicosities b/l. Marland Kitchen  Dermatological Examination: Pedal skin with normal turgor, texture and tone b/l. No open wounds. No interdigital macerations b/l. Toenails 1-5 b/l thickened, discolored, dystrophic with subungual debris. There is pain on palpation to dorsal aspect of nailplates. No corns, calluses nor porokeratotic lesions noted..  Neurological Examination: Protective sensation intact with 10 gram monofilament b/l LE. Vibratory sensation intact b/l LE.   Musculoskeletal Examination: Muscle strength 5/5 to all lower extremity muscle groups bilaterally. Pes planus deformity noted bilateral LE. Utilizes cane for ambulation assistance.  Assessment/Plan: 1. Pain due to onychomycosis of toenails of both feet   2. Controlled type 2 diabetes mellitus without complication, without long-term current use of insulin (HCC)     Patient was evaluated and treated. All patient's and/or POA's questions/concerns addressed on today's visit. Toenails 1-5 debrided in length and girth without incident. Continue foot  and shoe inspections daily. Monitor blood glucose per PCP/Endocrinologist's recommendations. Continue soft, supportive shoe gear daily. Report any pedal injuries to medical professional. Call office if there are any questions/concerns. -Patient/POA to call should there be question/concern in the interim.   Return in about 3 months (around 02/20/2024).  Freddie Breech, DPM      [place] LOCATION: [address] [place] [address]                   Office [phone]   [facility] LOCATION: [address] [place], [place] [address] Office [phone]

## 2023-12-12 ENCOUNTER — Other Ambulatory Visit: Payer: Medicare HMO

## 2023-12-13 ENCOUNTER — Other Ambulatory Visit (HOSPITAL_COMMUNITY): Payer: Self-pay

## 2023-12-14 ENCOUNTER — Other Ambulatory Visit: Payer: Medicare HMO

## 2024-01-02 DIAGNOSIS — G4733 Obstructive sleep apnea (adult) (pediatric): Secondary | ICD-10-CM | POA: Diagnosis not present

## 2024-01-18 ENCOUNTER — Other Ambulatory Visit: Payer: Medicare HMO

## 2024-01-23 ENCOUNTER — Other Ambulatory Visit (HOSPITAL_COMMUNITY): Payer: Self-pay

## 2024-02-09 ENCOUNTER — Other Ambulatory Visit (HOSPITAL_COMMUNITY): Payer: Self-pay

## 2024-02-09 DIAGNOSIS — Z794 Long term (current) use of insulin: Secondary | ICD-10-CM | POA: Diagnosis not present

## 2024-02-09 DIAGNOSIS — I10 Essential (primary) hypertension: Secondary | ICD-10-CM | POA: Diagnosis not present

## 2024-02-09 DIAGNOSIS — E1169 Type 2 diabetes mellitus with other specified complication: Secondary | ICD-10-CM | POA: Diagnosis not present

## 2024-02-09 DIAGNOSIS — E782 Mixed hyperlipidemia: Secondary | ICD-10-CM | POA: Diagnosis not present

## 2024-02-09 MED ORDER — MOUNJARO 15 MG/0.5ML ~~LOC~~ SOAJ
15.0000 mg | SUBCUTANEOUS | 3 refills | Status: DC
  Filled 2024-02-09: qty 2, 28d supply, fill #0
  Filled 2024-04-06 – 2024-04-07 (×2): qty 2, 28d supply, fill #1
  Filled 2024-05-17: qty 2, 28d supply, fill #2
  Filled 2024-06-18: qty 2, 28d supply, fill #3

## 2024-02-20 ENCOUNTER — Other Ambulatory Visit

## 2024-02-21 DIAGNOSIS — K900 Celiac disease: Secondary | ICD-10-CM | POA: Diagnosis not present

## 2024-02-21 DIAGNOSIS — K219 Gastro-esophageal reflux disease without esophagitis: Secondary | ICD-10-CM | POA: Diagnosis not present

## 2024-02-21 DIAGNOSIS — K5900 Constipation, unspecified: Secondary | ICD-10-CM | POA: Diagnosis not present

## 2024-02-21 DIAGNOSIS — R1310 Dysphagia, unspecified: Secondary | ICD-10-CM | POA: Diagnosis not present

## 2024-02-26 DIAGNOSIS — E1065 Type 1 diabetes mellitus with hyperglycemia: Secondary | ICD-10-CM | POA: Diagnosis not present

## 2024-02-29 ENCOUNTER — Other Ambulatory Visit: Payer: Self-pay | Admitting: Gastroenterology

## 2024-03-07 ENCOUNTER — Telehealth: Payer: Self-pay | Admitting: Podiatry

## 2024-03-07 NOTE — Telephone Encounter (Signed)
 Patient called she would like a RX sent for diabetic shoes.

## 2024-03-09 ENCOUNTER — Other Ambulatory Visit (INDEPENDENT_AMBULATORY_CARE_PROVIDER_SITE_OTHER): Admitting: Podiatry

## 2024-03-09 DIAGNOSIS — E1142 Type 2 diabetes mellitus with diabetic polyneuropathy: Secondary | ICD-10-CM

## 2024-03-09 DIAGNOSIS — M2041 Other hammer toe(s) (acquired), right foot: Secondary | ICD-10-CM

## 2024-03-09 DIAGNOSIS — M2141 Flat foot [pes planus] (acquired), right foot: Secondary | ICD-10-CM

## 2024-03-09 DIAGNOSIS — M2142 Flat foot [pes planus] (acquired), left foot: Secondary | ICD-10-CM

## 2024-03-09 DIAGNOSIS — M2042 Other hammer toe(s) (acquired), left foot: Secondary | ICD-10-CM

## 2024-03-09 NOTE — Progress Notes (Signed)
 1. Diabetic peripheral neuropathy associated with type 2 diabetes mellitus (HCC)   2. Pes planus of both feet   3. Acquired hammertoes of both feet

## 2024-03-12 DIAGNOSIS — H5213 Myopia, bilateral: Secondary | ICD-10-CM | POA: Diagnosis not present

## 2024-03-12 DIAGNOSIS — H524 Presbyopia: Secondary | ICD-10-CM | POA: Diagnosis not present

## 2024-03-14 ENCOUNTER — Other Ambulatory Visit (HOSPITAL_COMMUNITY): Payer: Self-pay

## 2024-03-14 ENCOUNTER — Ambulatory Visit (INDEPENDENT_AMBULATORY_CARE_PROVIDER_SITE_OTHER): Payer: Medicare HMO | Admitting: Podiatry

## 2024-03-14 DIAGNOSIS — B351 Tinea unguium: Secondary | ICD-10-CM

## 2024-03-14 DIAGNOSIS — M79675 Pain in left toe(s): Secondary | ICD-10-CM | POA: Diagnosis not present

## 2024-03-14 DIAGNOSIS — M79674 Pain in right toe(s): Secondary | ICD-10-CM

## 2024-03-14 DIAGNOSIS — E1142 Type 2 diabetes mellitus with diabetic polyneuropathy: Secondary | ICD-10-CM | POA: Diagnosis not present

## 2024-03-16 ENCOUNTER — Encounter (HOSPITAL_COMMUNITY): Payer: Self-pay | Admitting: Gastroenterology

## 2024-03-19 ENCOUNTER — Encounter: Payer: Self-pay | Admitting: Podiatry

## 2024-03-19 NOTE — Progress Notes (Signed)
  Subjective:  Patient ID: Sarah  Charyl House, female    DOB: 02/19/1956,  MRN: [id number]  68 y.o. female presents preventative diabetic foot care and painful elongated mycotic toenails 1-5 bilaterally which are tender when wearing enclosed shoe gear. Pain is relieved with periodic professional debridement. Chief Complaint  Patient presents with   Nail Problem    DFC   New problem(s): None   PCP is Virgle Grime, MD , and last visit was November 24, 2023.  No Known Allergies  Review of Systems: Negative except as noted in the HPI.   Objective:  Sarah  H House is a pleasant 68 y.o. female obese in NAD. AAO x 3.  Vascular Examination: Vascular status intact b/l with palpable pedal pulses. CFT immediate b/l. Pedal hair present. No edema. No pain with calf compression b/l. Skin temperature gradient WNL b/l. No varicosities noted. No cyanosis or clubbing noted.  Neurological Examination: Pt has subjective symptoms of neuropathy. Protective sensation intact 5/5 intact bilaterally with 10g monofilament b/l.  Dermatological Examination: Pedal skin with normal turgor, texture and tone b/l. No open wounds nor interdigital macerations noted. Toenails 1-5 b/l thick, discolored, elongated with subungual debris and pain on dorsal palpation. No hyperkeratotic lesions noted b/l.   Musculoskeletal Examination: Muscle strength 5/5 to all lower extremity muscle groups bilaterally. Pes planus deformity noted bilateral LE. Utilizes cane for ambulation assistance.  Radiographs: None  Assessment:   1. Pain due to onychomycosis of toenails of both feet   2. Diabetic peripheral neuropathy associated with type 2 diabetes mellitus (HCC)    Plan:  Consent given for treatment. Patient examined. All patient's and/or POA's questions/concerns addressed on today's visit. Mycotic toenails 1-5 debrided in length and girth without incident. Continue foot and shoe inspections daily. Monitor blood glucose per  PCP/Endocrinologist's recommendations.Continue soft, supportive shoe gear daily. Report any pedal injuries to medical professional. Call office if there are any quesitons/concerns. -Patient/POA to call should there be question/concern in the interim.  Return in about 3 months (around 06/14/2024).  Sarah House, DPM      [place] LOCATION: [address] [place] [address]                   Office [phone]   [facility] LOCATION: [address] [place], [place] [address] Office [phone]

## 2024-03-22 ENCOUNTER — Encounter (HOSPITAL_COMMUNITY): Payer: Self-pay | Admitting: Gastroenterology

## 2024-03-22 NOTE — Anesthesia Preprocedure Evaluation (Signed)
 Anesthesia Evaluation  Patient identified by MRN, date of birth, ID band Patient awake    Reviewed: Allergy & Precautions, NPO status , Patient's Chart, lab work & pertinent test results  History of Anesthesia Complications Negative for: history of anesthetic complications  Airway Mallampati: III  TM Distance: >3 FB Neck ROM: Full    Dental  (+) Dental Advisory Given,    Pulmonary neg shortness of breath, sleep apnea and Continuous Positive Airway Pressure Ventilation , neg COPD, neg recent URI   Pulmonary exam normal breath sounds clear to auscultation       Cardiovascular hypertension (amlodipine, HCTZ, losartan-HCTZ, olmesartan), Pt. on medications (-) angina (-) Past MI, (-) Cardiac Stents and (-) CABG (-) dysrhythmias  Rhythm:Regular Rate:Normal  HLD   Neuro/Psych  Headaches, neg Seizures PRES  Neuromuscular disease (polyneuropathy)    GI/Hepatic Neg liver ROS,GERD  Medicated,,Dysphagia, celiac disease   Endo/Other  diabetes, Type 2, Oral Hypoglycemic Agents, Insulin Dependent Hyperthyroidism (s/p radiation)   Renal/GU negative Renal ROS     Musculoskeletal  (+) Arthritis ,    Abdominal  (+) + obese  Peds  Hematology negative hematology ROS (+)   Anesthesia Other Findings Last Mounjaro : 03/15/2024  Reproductive/Obstetrics                             Anesthesia Physical Anesthesia Plan  ASA: 3  Anesthesia Plan: MAC   Post-op Pain Management: Minimal or no pain anticipated   Induction: Intravenous  PONV Risk Score and Plan: 2 and Propofol  infusion, TIVA and Treatment may vary due to age or medical condition  Airway Management Planned: Natural Airway and Nasal Cannula  Additional Equipment:   Intra-op Plan:   Post-operative Plan:   Informed Consent: I have reviewed the patients History and Physical, chart, labs and discussed the procedure including the risks, benefits and  alternatives for the proposed anesthesia with the patient or authorized representative who has indicated his/her understanding and acceptance.     Dental advisory given  Plan Discussed with: Anesthesiologist and CRNA  Anesthesia Plan Comments: (Discussed with patient risks of MAC including, but not limited to, minor pain or discomfort, hearing people in the room, and possible need for backup general anesthesia. Risks for general anesthesia also discussed including, but not limited to, sore throat, hoarse voice, chipped/damaged teeth, injury to vocal cords, nausea and vomiting, allergic reactions, lung infection, heart attack, stroke, and death. All questions answered. )       Anesthesia Quick Evaluation

## 2024-03-23 ENCOUNTER — Ambulatory Visit (HOSPITAL_COMMUNITY)
Admission: RE | Admit: 2024-03-23 | Discharge: 2024-03-23 | Disposition: A | Discharge status: Home / Self Care | Attending: Gastroenterology | Admitting: Gastroenterology

## 2024-03-23 ENCOUNTER — Encounter (HOSPITAL_COMMUNITY)
Admission: RE | Disposition: A | Discharge status: Home / Self Care | Payer: Self-pay | Source: Home / Self Care | Attending: Gastroenterology

## 2024-03-23 ENCOUNTER — Ambulatory Visit (HOSPITAL_COMMUNITY): Payer: Self-pay | Admitting: Anesthesiology

## 2024-03-23 ENCOUNTER — Other Ambulatory Visit: Payer: Self-pay

## 2024-03-23 DIAGNOSIS — R1310 Dysphagia, unspecified: Secondary | ICD-10-CM | POA: Diagnosis not present

## 2024-03-23 DIAGNOSIS — Z79899 Other long term (current) drug therapy: Secondary | ICD-10-CM | POA: Insufficient documentation

## 2024-03-23 DIAGNOSIS — K3189 Other diseases of stomach and duodenum: Secondary | ICD-10-CM | POA: Diagnosis not present

## 2024-03-23 DIAGNOSIS — K229 Disease of esophagus, unspecified: Secondary | ICD-10-CM

## 2024-03-23 DIAGNOSIS — K222 Esophageal obstruction: Secondary | ICD-10-CM | POA: Diagnosis not present

## 2024-03-23 DIAGNOSIS — K900 Celiac disease: Secondary | ICD-10-CM | POA: Diagnosis not present

## 2024-03-23 DIAGNOSIS — Z7984 Long term (current) use of oral hypoglycemic drugs: Secondary | ICD-10-CM | POA: Insufficient documentation

## 2024-03-23 DIAGNOSIS — K219 Gastro-esophageal reflux disease without esophagitis: Secondary | ICD-10-CM | POA: Insufficient documentation

## 2024-03-23 DIAGNOSIS — E1142 Type 2 diabetes mellitus with diabetic polyneuropathy: Secondary | ICD-10-CM | POA: Insufficient documentation

## 2024-03-23 DIAGNOSIS — I10 Essential (primary) hypertension: Secondary | ICD-10-CM | POA: Diagnosis not present

## 2024-03-23 DIAGNOSIS — Z794 Long term (current) use of insulin: Secondary | ICD-10-CM | POA: Diagnosis not present

## 2024-03-23 HISTORY — PX: ESOPHAGOGASTRODUODENOSCOPY: SHX5428

## 2024-03-23 LAB — GLUCOSE, CAPILLARY: Glucose-Capillary: 142 mg/dL — ABNORMAL HIGH (ref 70–99)

## 2024-03-23 SURGERY — EGD (ESOPHAGOGASTRODUODENOSCOPY)
Anesthesia: Monitor Anesthesia Care

## 2024-03-23 MED ORDER — ONDANSETRON HCL 4 MG/2ML IJ SOLN
INTRAMUSCULAR | Status: DC | PRN
  Administered 2024-03-23: 4 mg via INTRAVENOUS

## 2024-03-23 MED ORDER — PROPOFOL 500 MG/50ML IV EMUL
INTRAVENOUS | Status: DC | PRN
  Administered 2024-03-23: 150 ug/kg/min via INTRAVENOUS
  Administered 2024-03-23: 30 mg via INTRAVENOUS
  Administered 2024-03-23: 50 mg via INTRAVENOUS

## 2024-03-23 MED ORDER — SODIUM CHLORIDE 0.9 % IV SOLN: INTRAVENOUS | Status: DC | PRN

## 2024-03-23 MED ORDER — LIDOCAINE HCL (PF) 2 % IJ SOLN
INTRAMUSCULAR | Status: DC | PRN
  Administered 2024-03-23: 60 mg via INTRADERMAL

## 2024-03-23 NOTE — Op Note (Signed)
 [facility] Patient Name: Sarah House Procedure Date: 03/23/2024 MRN: [id number] Attending MD: Alvis Jourdain , MD, [id number] Date of Birth: 08/19/1956 CSN: [id number] Age: 68 Admit Type: Ambulatory Procedure:                Upper GI endoscopy Indications:              Dysphagia Providers:                Alvis Jourdain, MD, Jacquelyn "Jaci" Bernetta Brilliant, RN, Gabino Joe, Technician Referring MD:              Medicines:                Propofol  per Anesthesia Complications:            No immediate complications. Estimated Blood Loss:     Estimated blood loss was minimal. Procedure:                Pre-Anesthesia Assessment:                           - Prior to the procedure, a History and Physical                            was performed, and patient medications and                            allergies were reviewed. The patient's tolerance of                            previous anesthesia was also reviewed. The risks                            and benefits of the procedure and the sedation                            options and risks were discussed with the patient.                            All questions were answered, and informed consent                            was obtained. Prior Anticoagulants: The patient has                            taken no anticoagulant or antiplatelet agents. ASA                            Grade Assessment: III - A patient with severe                            systemic disease. After reviewing the risks and  benefits, the patient was deemed in satisfactory                            condition to undergo the procedure.                           - Sedation was administered by an anesthesia                            professional. Deep sedation was attained.                           After obtaining informed consent, the endoscope was                            passed under direct vision.  Throughout the                            procedure, the patient's blood pressure, pulse, and                            oxygen saturations were monitored continuously. The                            GIF-H190 ([id number]) Olympus endoscope was introduced                            through the mouth, and advanced to the second part                            of duodenum. The upper GI endoscopy was                            accomplished without difficulty. The patient                            tolerated the procedure well. Scope In: Scope Out: Findings:      One benign-appearing, intrinsic mild stenosis was found in the lower       third of the esophagus. This stenosis measured 1 cm (inner diameter) x 1       cm (in length). The stenosis was traversed. A guidewire was placed and       the scope was withdrawn. Dilation was performed with a Savary dilator       with no resistance at 13 mm. The dilation site was examined following       endoscope reinsertion and showed mild mucosal disruption. Estimated       blood loss was minimal.      Diffuse, white plaques were found in the entire esophagus.      A single 7 mm papule (nodule) with no bleeding and no stigmata of recent       bleeding was found in the cardia. Biopsies were taken with a cold       forceps for histology.      The examined duodenum was normal.      In the distal esophagus there  was evidence of an eccentric benign 1 cm       stricturing. The diameter was felt to be around 1 cm. Savary dilation       was performed using the 11, 12, and 13 mm Savary dilators. Post       inspection only revealed a minimal mucosal disrupation. The decision was       made to disrupt an eccentric scar formation with the cold biopsy       forceps, which was successful. Treatment of her candidal esophagitis as       well as the combination dilation treatment may negate a follow up EGD       with dilation. A nodule was biopsied from the gastric  cardia. Impression:               - Benign-appearing esophageal stenosis. Dilated.                           - Esophageal plaques were found, consistent with                            candidiasis.                           - A single papule (nodule) found in the stomach.                            Biopsied.                           - Normal examined duodenum. Moderate Sedation:      Not Applicable - Patient had care per Anesthesia. Recommendation:           - Patient has a contact number available for                            emergencies. The signs and symptoms of potential                            delayed complications were discussed with the                            patient. Return to normal activities tomorrow.                            Written discharge instructions were provided to the                            patient.                           - Resume previous diet.                           - Continue present medications.                           - Await pathology results.                           -  Fluconazole 200 mg QD x 14 days.                           - Follow up with Dr. Tova Fresh in 1 month. Procedure Code(s):        --- Professional ---                           [phone], Esophagogastroduodenoscopy, flexible,                            transoral; with insertion of guide wire followed by                            passage of dilator(s) through esophagus over guide                            wire                           43239, 59, Esophagogastroduodenoscopy, flexible,                            transoral; with biopsy, single or multiple Diagnosis Code(s):        --- Professional ---                           K22.2, Esophageal obstruction                           K22.9, Disease of esophagus, unspecified                           K31.89, Other diseases of stomach and duodenum                           R13.10, Dysphagia, unspecified CPT copyright 2022 American  Medical Association. All rights reserved. The codes documented in this report are preliminary and upon coder review may  be revised to meet current compliance requirements. Alvis Jourdain, MD Alvis Jourdain, MD 03/23/2024 10:18:58 AM This report has been signed electronically. Number of Addenda: 0

## 2024-03-23 NOTE — Transfer of Care (Signed)
 Immediate Anesthesia Transfer of Care Note  Patient: Sarah  H House  Procedure(s) Performed: EGD (ESOPHAGOGASTRODUODENOSCOPY)  Patient Location: PACU and Endoscopy Unit  Anesthesia Type:MAC  Level of Consciousness: awake  Airway & Oxygen Therapy: Patient Spontanous Breathing and Patient connected to face mask oxygen  Post-op Assessment: Report given to RN and Post -op Vital signs reviewed and stable  Post vital signs: Reviewed and stable  Last Vitals:  Vitals Value Taken Time  BP    Temp    Pulse 88 03/23/24 1015  Resp 18 03/23/24 1015  SpO2 93 % 03/23/24 1015  Vitals shown include unfiled device data.  Last Pain:  Vitals:   03/23/24 0854  TempSrc: Temporal  PainSc: 0-No pain         Complications: No notable events documented.

## 2024-03-23 NOTE — Anesthesia Postprocedure Evaluation (Signed)
 Anesthesia Post Note  Patient: Sarah  H House  Procedure(s) Performed: EGD (ESOPHAGOGASTRODUODENOSCOPY)     Patient location during evaluation: PACU Anesthesia Type: MAC Level of consciousness: awake Pain management: pain level controlled Vital Signs Assessment: post-procedure vital signs reviewed and stable Respiratory status: spontaneous breathing, nonlabored ventilation and respiratory function stable Cardiovascular status: stable and blood pressure returned to baseline Postop Assessment: no apparent nausea or vomiting Anesthetic complications: no   No notable events documented.  Last Vitals:  Vitals:   03/23/24 1015 03/23/24 1020  BP: (!) 129/43 (!) 124/47  Pulse: 89 85  Resp: 20 18  Temp:    SpO2: 93% 95%    Last Pain:  Vitals:   03/23/24 1020  TempSrc:   PainSc: 0-No pain                 Conard Decent

## 2024-03-23 NOTE — Discharge Instructions (Signed)
YOU HAD AN ENDOSCOPIC PROCEDURE TODAY: Refer to the procedure report and other information in the discharge instructions given to you for any specific questions about what was found during the examination. If this information does not answer your questions, please call [facility] at [phone] to clarify.   YOU SHOULD EXPECT: Some feelings of bloating in the abdomen. Passage of more gas than usual. Walking can help get rid of the air that was put into your GI tract during the procedure and reduce the bloating.  DIET: Your first meal following the procedure should be a light meal and then it is ok to progress to your normal diet. A half-sandwich or bowl of soup is an example of a good first meal. Heavy or fried foods are harder to digest and may make you feel nauseous or bloated. Drink plenty of fluids but you should avoid alcoholic beverages for 24 hours. If you had an esophageal dilation, please see attached information for diet.   ACTIVITY: Your care partner should take you home directly after the procedure. You should plan to take it easy, moving slowly for the rest of the day. You can resume normal activity the day after the procedure however YOU SHOULD NOT DRIVE, use power tools, machinery or perform tasks that involve climbing or major physical exertion for 24 hours (because of the sedation medicines used during the test).   SYMPTOMS TO REPORT IMMEDIATELY: A gastroenterologist can be reached at any hour. Please call [phone]  for any of the following symptoms:   Following upper endoscopy (EGD, EUS, ERCP, esophageal dilation) Vomiting of blood or coffee ground material  New, significant abdominal pain  New, significant chest pain or pain under the shoulder blades  Painful or persistently difficult swallowing  New shortness of breath  Black, tarry-looking or red, bloody stools  FOLLOW UP:  If any biopsies were taken you will be contacted by phone or by letter within the next  1-3 weeks. Call [phone]  if you have not heard about the biopsies in 3 weeks.  Please also call with any specific questions about appointments or follow up tests.  

## 2024-03-23 NOTE — H&P (Signed)
 Sarah  H House HPI: This 68 year old black female presents to the office for further evaluation of difficulty swallowing which started last month when she ate a steak. She went to [facility] and was given glucagon  which helped with the passage of the food bolus. She has had multiple episodes like this in the past, especially when attempting to eat breads and meats She has had a few other episodes of near choking. She is taking Omeprazole 20 mg once per day for acid reflux. She denies any NSAID use. She has a history of dysphagia and had an EGD with dialation done in  2017. She has 1 BM every 2 days with no obvious blood or mucus in the stool. She has a good appetite and her weight has been stable. She denies having any complaints of abdominal pain, nausea, vomiting or odynophagia. She denies having a family history of colon cancer, celiac sprue or IBD. She had a normal colonoscopy done on 04/09/2016. She was noted to have duodenal lymphocytosis on previous small bowel biopsies but does not follow a gluten-free diet as previously advised.   Past Medical History:  Diagnosis Date   Anemia    Arthritis    knees   Diabetes mellitus without complication (HCC)    GERD (gastroesophageal reflux disease)    Hypercholesterolemia    Hypertension    Hyperthyroidism    had "radiation to kill it"    Past Surgical History:  Procedure Laterality Date   BREAST LUMPECTOMY WITH RADIOACTIVE SEED LOCALIZATION Right 12/23/2015   Procedure: RIGHT BREAST LUMPECTOMY WITH RADIOACTIVE SEED LOCALIZATION;  Surgeon: Oza Blumenthal, MD;  Location: [facility];  Service: General;  Laterality: Right;   COLONOSCOPY W/ POLYPECTOMY     COLONOSCOPY WITH PROPOFOL  N/A 04/09/2016   Procedure: COLONOSCOPY WITH PROPOFOL ;  Surgeon: Alvis Jourdain, MD;  Location: [facility];  Service: Endoscopy;  Laterality: N/A;   ESOPHAGOGASTRODUODENOSCOPY (EGD) WITH PROPOFOL  N/A 04/09/2016   Procedure:  ESOPHAGOGASTRODUODENOSCOPY (EGD) WITH PROPOFOL ;  Surgeon: Alvis Jourdain, MD;  Location: [facility];  Service: Endoscopy;  Laterality: N/A;   GIVENS CAPSULE STUDY N/A 06/21/2016   Procedure: GIVENS CAPSULE STUDY;  Surgeon: Tami Falcon, MD;  Location: [facility];  Service: Endoscopy;  Laterality: N/A;   KNEE ARTHROSCOPY      Family History  Problem Relation Age of Onset   Diabetes Mother    Lymphoma Father    Leukemia Maternal Uncle    Cancer Paternal Grandfather        stomach   Heart disease Sister     Social History:  reports that she has never smoked. She has never used smokeless tobacco. She reports that she does not drink alcohol and does not use drugs.  Allergies: No Known Allergies  Medications: Scheduled: Continuous:  Results for orders placed or performed during the hospital encounter of 03/23/24 (from the past 24 hours)  Glucose, capillary     Status: Abnormal   Collection Time: 03/23/24  9:28 AM  Result Value Ref Range   Glucose-Capillary 142 (H) 70 - 99 mg/dL     No results found.  ROS:  As stated above in the HPI otherwise negative.  Blood pressure (!) 150/62, pulse 73, temperature 98.2 F (36.8 C), temperature source Temporal, resp. rate 10, height 5\' 7"  (1.702 m), weight 113.4 kg, SpO2 96%.    PE: Gen: NAD, Alert and Oriented HEENT:  [place]/AT, EOMI Neck: Supple, no LAD Lungs: CTA Bilaterally CV: RRR without M/G/R ABD: Soft, NTND, +BS  Ext: No C/C/E  Assessment/Plan: 1) Dysphagia - EGD with dilation.  Jaymison Luber D 03/23/2024, 9:45 AM

## 2024-03-26 LAB — SURGICAL PATHOLOGY

## 2024-03-27 ENCOUNTER — Encounter (HOSPITAL_COMMUNITY): Payer: Self-pay | Admitting: Gastroenterology

## 2024-03-28 DIAGNOSIS — E1065 Type 1 diabetes mellitus with hyperglycemia: Secondary | ICD-10-CM | POA: Diagnosis not present

## 2024-04-05 DIAGNOSIS — Z01419 Encounter for gynecological examination (general) (routine) without abnormal findings: Secondary | ICD-10-CM | POA: Diagnosis not present

## 2024-04-05 DIAGNOSIS — I10 Essential (primary) hypertension: Secondary | ICD-10-CM | POA: Diagnosis not present

## 2024-04-07 ENCOUNTER — Other Ambulatory Visit (HOSPITAL_COMMUNITY): Payer: Self-pay

## 2024-04-17 DIAGNOSIS — K5900 Constipation, unspecified: Secondary | ICD-10-CM | POA: Diagnosis not present

## 2024-04-17 DIAGNOSIS — K219 Gastro-esophageal reflux disease without esophagitis: Secondary | ICD-10-CM | POA: Diagnosis not present

## 2024-04-17 DIAGNOSIS — B3781 Candidal esophagitis: Secondary | ICD-10-CM | POA: Diagnosis not present

## 2024-04-26 DIAGNOSIS — H52209 Unspecified astigmatism, unspecified eye: Secondary | ICD-10-CM | POA: Diagnosis not present

## 2024-04-26 DIAGNOSIS — H524 Presbyopia: Secondary | ICD-10-CM | POA: Diagnosis not present

## 2024-04-26 DIAGNOSIS — H5213 Myopia, bilateral: Secondary | ICD-10-CM | POA: Diagnosis not present

## 2024-04-27 DIAGNOSIS — E1065 Type 1 diabetes mellitus with hyperglycemia: Secondary | ICD-10-CM | POA: Diagnosis not present

## 2024-05-17 ENCOUNTER — Other Ambulatory Visit (HOSPITAL_BASED_OUTPATIENT_CLINIC_OR_DEPARTMENT_OTHER): Payer: Self-pay

## 2024-05-17 DIAGNOSIS — E7841 Elevated Lipoprotein(a): Secondary | ICD-10-CM | POA: Diagnosis not present

## 2024-05-17 DIAGNOSIS — R5383 Other fatigue: Secondary | ICD-10-CM | POA: Diagnosis not present

## 2024-05-17 DIAGNOSIS — E1122 Type 2 diabetes mellitus with diabetic chronic kidney disease: Secondary | ICD-10-CM | POA: Diagnosis not present

## 2024-05-17 DIAGNOSIS — N182 Chronic kidney disease, stage 2 (mild): Secondary | ICD-10-CM | POA: Diagnosis not present

## 2024-05-17 DIAGNOSIS — D508 Other iron deficiency anemias: Secondary | ICD-10-CM | POA: Diagnosis not present

## 2024-05-24 DIAGNOSIS — Z794 Long term (current) use of insulin: Secondary | ICD-10-CM | POA: Diagnosis not present

## 2024-05-24 DIAGNOSIS — E782 Mixed hyperlipidemia: Secondary | ICD-10-CM | POA: Diagnosis not present

## 2024-05-24 DIAGNOSIS — G4733 Obstructive sleep apnea (adult) (pediatric): Secondary | ICD-10-CM | POA: Diagnosis not present

## 2024-05-24 DIAGNOSIS — I10 Essential (primary) hypertension: Secondary | ICD-10-CM | POA: Diagnosis not present

## 2024-05-24 DIAGNOSIS — Z0000 Encounter for general adult medical examination without abnormal findings: Secondary | ICD-10-CM | POA: Diagnosis not present

## 2024-05-24 DIAGNOSIS — N182 Chronic kidney disease, stage 2 (mild): Secondary | ICD-10-CM | POA: Diagnosis not present

## 2024-05-24 DIAGNOSIS — E1122 Type 2 diabetes mellitus with diabetic chronic kidney disease: Secondary | ICD-10-CM | POA: Diagnosis not present

## 2024-05-24 DIAGNOSIS — D508 Other iron deficiency anemias: Secondary | ICD-10-CM | POA: Diagnosis not present

## 2024-05-24 DIAGNOSIS — K900 Celiac disease: Secondary | ICD-10-CM | POA: Diagnosis not present

## 2024-05-24 DIAGNOSIS — M170 Bilateral primary osteoarthritis of knee: Secondary | ICD-10-CM | POA: Diagnosis not present

## 2024-05-28 DIAGNOSIS — E1065 Type 1 diabetes mellitus with hyperglycemia: Secondary | ICD-10-CM | POA: Diagnosis not present

## 2024-06-11 DIAGNOSIS — Z1231 Encounter for screening mammogram for malignant neoplasm of breast: Secondary | ICD-10-CM | POA: Diagnosis not present

## 2024-06-18 ENCOUNTER — Encounter: Payer: Self-pay | Admitting: *Deleted

## 2024-06-19 ENCOUNTER — Telehealth: Payer: Medicare Other | Admitting: Adult Health

## 2024-06-19 DIAGNOSIS — G4733 Obstructive sleep apnea (adult) (pediatric): Secondary | ICD-10-CM

## 2024-06-19 NOTE — Progress Notes (Signed)
 PATIENT: Sarah House DOB: 1956/08/28  REASON FOR VISIT: follow up HISTORY FROM: patient PRIMARY NEUROLOGIST: Dr. Chalice  Virtual Visit via Video Note  I connected with Sarah House on 06/19/24 at  2:15 PM EDT by a video enabled telemedicine application located remotely at [facility] and verified that I am speaking with the correct person using two identifiers who was located at their own home in [place]   I discussed the limitations of evaluation and management by telemedicine and the availability of in person appointments. The patient expressed understanding and agreed to proceed.   PATIENT: Sarah House DOB: 06/22/1956  REASON FOR VISIT: follow up HISTORY FROM: patient  HISTORY OF PRESENT ILLNESS: Today 06/19/24:  Sarah House is a 68 y.o. female with a history of OSA on CPAP. Returns today for follow-up.  Reports that CPAP is working well.  She states that she has been under a lot of stress recently as her sister passed and her husband is in the hospital.  Denies any changes in her weight.  Does note that she has not been sleeping well due to stress.  Her download is below     06/21/23: Sarah House is a 68 y.o. female with a history of obstructive sleep apnea on CPAP. Returns today for follow-up.  Overall she feels that the CPAP is working well for her.  She denies any new issues.  Her download is below     06/22/22: Sarah House is a 68 year old female with a history of obstructive sleep apnea on CPAP.  She returns today for follow-up.  Her download is below.  She reports that the CPAP is working well for her.  Denies any new issues.  She returns today for an evaluation.    Sarah House  is a 68 y.o. female who has been followed in this office for obstructive sleep apnea on CPAP. She reports no issues and it is working well for her. Download is below.    HISTORY Sarah House is a 68 year old female with a history of obstructive sleep apnea  on CPAP.  Her download indicates that she use her machine nightly for compliance of 100%.  She used her machine greater than 4 hours each night.  On average she uses her machine 8 hours and 41 minutes.  Her residual AHI is 4.3 on 7 to 13 cm of water with EPR of 3.  Her leak in the 95th percentile is 2.6 L/min.  She reports that the CPAP is working well for her.  She can tell a difference in how she feels during the day.    REVIEW OF SYSTEMS: Out of a complete 14 system review of symptoms, the patient complains only of the following symptoms, and all other reviewed systems are negative.  ALLERGIES: No Known Allergies  HOME MEDICATIONS: Outpatient Medications Prior to Visit  Medication Sig Dispense Refill   ACCU-CHEK GUIDE test strip TAKE AS DIRECTED TWICE A DAY     amLODipine (NORVASC) 5 MG tablet Take 5 mg by mouth daily.     aspirin EC 81 MG tablet Take 81 mg by mouth daily.     atorvastatin (LIPITOR) 40 MG tablet Take 80 mg by mouth daily.     Blood Glucose Monitoring Suppl (ACCU-CHEK GUIDE) w/Device KIT See admin instructions.     calcium carbonate (CALCIUM 600) 1500 (600 Ca) MG TABS tablet 1 tablet with meals     Calcium Carbonate-Vitamin D (CALCIUM  500 + D PO) Take 1 tablet by mouth daily.     cetirizine (ZYRTEC ALLERGY) 10 MG tablet 1 tablet as needed     Fluticasone-Umeclidin-Vilant (TRELEGY ELLIPTA IN) 1 puff     glimepiride (AMARYL) 4 MG tablet Take 8 mg by mouth daily with breakfast.     Glucagon  (GVOKE HYPOPEN  2-PACK) 1 MG/0.2ML SOAJ See admin instructions.     glucose blood test strip 3 (three) times daily.     hydrochlorothiazide (HYDRODIURIL) 25 MG tablet      Insulin Degludec (TRESIBA FLEXTOUCH) 200 UNIT/ML SOPN Inject 36 Units into the skin daily.     insulin lispro (HUMALOG KWIKPEN) 100 UNIT/ML KiwkPen Inject 6 Units into the skin.      Insulin Pen Needle (B-D ULTRAFINE III SHORT PEN) 31G X 8 MM MISC See admin instructions.     Iron-FA-B Cmp-C-Biot-Probiotic (FUSION PLUS)  CAPS Take 1 capsule by mouth daily.     losartan-hydrochlorothiazide (HYZAAR) 100-25 MG per tablet Take 1 tablet by mouth daily.     metFORMIN (GLUCOPHAGE-XR) 500 MG 24 hr tablet Take 2 tablets by mouth 2 (two) times daily.     METROGEL 1 % gel Apply 1 application topically daily as needed (Apply to affected area.).      olmesartan (BENICAR) 40 MG tablet      omeprazole (PRILOSEC) 20 MG capsule 1 capsule     ondansetron  (ZOFRAN ) 4 MG tablet Take 4 mg by mouth every 8 (eight) hours as needed.     tirzepatide  (MOUNJARO ) 10 MG/0.5ML Pen Inject 10 mg into the skin once a week. 2 mL 2   tirzepatide  (MOUNJARO ) 10 MG/0.5ML Pen Inject 10 mg into the skin once a week. 2 mL 6   tirzepatide  (MOUNJARO ) 12.5 MG/0.5ML Pen Inject 12.5 mg into the skin once a week. 2 mL 5   tirzepatide  (MOUNJARO ) 15 MG/0.5ML Pen Inject 15 mg into the skin once a week. 2 mL 3   tirzepatide  (MOUNJARO ) 7.5 MG/0.5ML Pen Inject 7.5 mg Subcutaneously once a week 2 mL 2   TRELEGY ELLIPTA 100-62.5-25 MCG/INH AEPB Inhale 1 puff into the lungs daily.     triamcinolone (NASACORT ALLERGY 24HR CHILDREN) 55 MCG/ACT AERO nasal inhaler 1 spray in each nostril     No facility-administered medications prior to visit.    PAST MEDICAL HISTORY: Past Medical History:  Diagnosis Date   Anemia    Arthritis    knees   Diabetes mellitus without complication (HCC)    GERD (gastroesophageal reflux disease)    Hypercholesterolemia    Hypertension    Hyperthyroidism    had radiation to kill it    PAST SURGICAL HISTORY: Past Surgical History:  Procedure Laterality Date   BREAST LUMPECTOMY WITH RADIOACTIVE SEED LOCALIZATION Right 12/23/2015   Procedure: RIGHT BREAST LUMPECTOMY WITH RADIOACTIVE SEED LOCALIZATION;  Surgeon: Vicenta Poli, MD;  Location: [facility];  Service: General;  Laterality: Right;   COLONOSCOPY W/ POLYPECTOMY     COLONOSCOPY WITH PROPOFOL  N/A 04/09/2016   Procedure: COLONOSCOPY WITH PROPOFOL ;  Surgeon:  Belvie Just, MD;  Location: [facility];  Service: Endoscopy;  Laterality: N/A;   ESOPHAGOGASTRODUODENOSCOPY N/A 03/23/2024   Procedure: EGD (ESOPHAGOGASTRODUODENOSCOPY);  Surgeon: Just Belvie, MD;  Location: [facility];  Service: Gastroenterology;  Laterality: N/A;   ESOPHAGOGASTRODUODENOSCOPY (EGD) WITH PROPOFOL  N/A 04/09/2016   Procedure: ESOPHAGOGASTRODUODENOSCOPY (EGD) WITH PROPOFOL ;  Surgeon: Belvie Just, MD;  Location: [facility];  Service: Endoscopy;  Laterality: N/A;   GIVENS CAPSULE STUDY N/A 06/21/2016  Procedure: GIVENS CAPSULE STUDY;  Surgeon: Renaye Sous, MD;  Location: [facility];  Service: Endoscopy;  Laterality: N/A;   KNEE ARTHROSCOPY      FAMILY HISTORY: Family History  Problem Relation Age of Onset   Diabetes Mother    Lymphoma Father    Leukemia Maternal Uncle    Cancer Paternal Grandfather        stomach   Heart disease Sister     SOCIAL HISTORY: Social History   Socioeconomic History   Marital status: Married    Spouse name: Lamar   Number of children: 1   Years of education: 14   Highest education level: Not on file  Occupational History    Comment: Primary school teacher  Tobacco Use   Smoking status: Never   Smokeless tobacco: Never  Substance and Sexual Activity   Alcohol use: No    Alcohol/week: 0.0 standard drinks of alcohol   Drug use: No   Sexual activity: Not on file  Other Topics Concern   Not on file  Social History Narrative   Lives with husband   Caffeine- coffee 1 cup daily   Social Drivers of Corporate investment banker Strain: Not on file  Food Insecurity: Not on file  Transportation Needs: Not on file  Physical Activity: Not on file  Stress: Not on file  Social Connections: Not on file  Intimate Partner Violence: Not on file      PHYSICAL EXAM Generalized: Well developed, in no acute distress   Neurological examination  Mentation: Alert oriented to time, place, history taking. Follows all commands  speech and language fluent Cranial nerve II-XII: Facial symmetry noted Reflexes: UTA  DIAGNOSTIC DATA (LABS, IMAGING, TESTING) - I reviewed patient records, labs, notes, testing and imaging myself where available.  Lab Results  Component Value Date   WBC 12.1 (H) 08/02/2018   HGB 11.5 (L) 08/02/2018   HCT 40.0 08/02/2018   MCV 85.1 08/02/2018   PLT 450 (H) 08/02/2018      Component Value Date/Time   NA 145 08/02/2018 1148   K 3.7 08/02/2018 1148   CL 107 08/02/2018 1148   CO2 28 08/02/2018 1148   GLUCOSE 119 (H) 08/02/2018 1148   BUN 12 08/02/2018 1148   CREATININE 0.73 08/02/2018 1148   CALCIUM 9.1 08/02/2018 1148   GFRNONAA >60 08/02/2018 1148   GFRAA >60 08/02/2018 1148      ASSESSMENT AND PLAN 68 y.o. year old female  has a past medical history of Anemia, Arthritis, Diabetes mellitus without complication (HCC), GERD (gastroesophageal reflux disease), Hypercholesterolemia, Hypertension, and Hyperthyroidism. here with:  OSA on CPAP  CPAP compliance excellent Residual AHI is slightly elevated I will adjust pressure 7 to 15 cm of water Advised her to reach out in 1 month and we will pull another download Encouraged patient to continue using CPAP nightly and > 4 hours each night F/U in 1 year or sooner if needed    Duwaine Russell, MSN, NP-C 06/19/2024, 2:09 PM [facility] [address] [place], [place] [address] [phone]  The patient's condition requires frequent monitoring and adjustments in the treatment plan, reflecting the ongoing complexity of care.  This provider is the continuing focal point for all needed services for this condition.

## 2024-06-19 NOTE — Patient Instructions (Signed)
 Continue using CPAP nightly and greater than 4 hours each night We will adjust pressure contact us  in 1 month and we will pull a download If your symptoms worsen or you develop new symptoms please let us  know.

## 2024-06-20 ENCOUNTER — Encounter: Payer: Self-pay | Admitting: Podiatry

## 2024-06-20 ENCOUNTER — Ambulatory Visit (INDEPENDENT_AMBULATORY_CARE_PROVIDER_SITE_OTHER): Admitting: Podiatry

## 2024-06-20 DIAGNOSIS — M2041 Other hammer toe(s) (acquired), right foot: Secondary | ICD-10-CM

## 2024-06-20 DIAGNOSIS — M2142 Flat foot [pes planus] (acquired), left foot: Secondary | ICD-10-CM | POA: Diagnosis not present

## 2024-06-20 DIAGNOSIS — M79675 Pain in left toe(s): Secondary | ICD-10-CM

## 2024-06-20 DIAGNOSIS — E1142 Type 2 diabetes mellitus with diabetic polyneuropathy: Secondary | ICD-10-CM

## 2024-06-20 DIAGNOSIS — B351 Tinea unguium: Secondary | ICD-10-CM

## 2024-06-20 DIAGNOSIS — M2042 Other hammer toe(s) (acquired), left foot: Secondary | ICD-10-CM | POA: Diagnosis not present

## 2024-06-20 DIAGNOSIS — M2141 Flat foot [pes planus] (acquired), right foot: Secondary | ICD-10-CM | POA: Diagnosis not present

## 2024-06-20 DIAGNOSIS — M79674 Pain in right toe(s): Secondary | ICD-10-CM

## 2024-06-20 NOTE — Progress Notes (Signed)
 Pressure change order sent to [facility].

## 2024-06-21 NOTE — Progress Notes (Signed)
 New, Adine Boring, Heather CROME, RN; Joylene Adine; Tucker, Dolanda; Garcia, Patricia; Ziegler, Melissa; 1 other Received, thank you!

## 2024-06-21 NOTE — Progress Notes (Signed)
  Subjective:  Patient ID: Sarah House, female    DOB: [birth year],  MRN: [id number]  Sarah House presents to clinic today for preventative diabetic foot care for painful thick toenails that are difficult to trim. Pain interferes with ambulation. Aggravating factors include wearing enclosed shoe gear. Pain is relieved with periodic professional debridement. Patient is requesting diabetic shoes today. Patient states she lost her sister recently. Her husband is also in the hospital recovering from a heart attack and stroke. Chief Complaint  Patient presents with   Diabetes    DFC IDDM A1C 8.8. Toenail trim. LOV with PCP 05/24/2024.   New problem(s): None.   PCP is Verdia Lombard, MD.  Allergies  Allergen Reactions   [place] Auer[place] Degludec]     Review of Systems: Negative except as noted in the HPI.  Objective: No changes noted in today's physical examination. There were no vitals filed for this visit. Sarah House is a pleasant 68 y.o. female obese in NAD. AAO x 3.  Vascular Examination: Vascular status intact b/l with palpable pedal pulses. CFT immediate b/l. Pedal hair present. No edema. No pain with calf compression b/l. Skin temperature gradient WNL b/l. No varicosities noted. No cyanosis or clubbing noted.  Neurological Examination: Pt has subjective symptoms of neuropathy. Protective sensation intact 5/5 intact bilaterally with 10g monofilament b/l.  Dermatological Examination: Pedal skin with normal turgor, texture and tone b/l. No open wounds nor interdigital macerations noted. Toenails 1-5 b/l thick, discolored, elongated with subungual debris and pain on dorsal palpation. No hyperkeratotic lesions noted b/l.   Musculoskeletal Examination: Muscle strength 5/5 to all lower extremity muscle groups bilaterally. Pes planus deformity noted bilateral LE. Hammertoes 2-5 b/l. Utilizes cane for ambulation assistance.  Radiographs: None  Assessment/Plan: 1. Pain  due to onychomycosis of toenails of both feet   2. Pes planus of both feet   3. Acquired hammertoes of both feet   4. Diabetic peripheral neuropathy associated with type 2 diabetes mellitus (HCC)     Patient was evaluated and treated. All patient's and/or POA's questions/concerns addressed on today's visit. Toenails 1-5 debrided in length and girth without incident. Continue foot and shoe inspections daily. Monitor blood glucose per PCP/Endocrinologist's recommendations. Continue soft, supportive shoe gear daily. Report any pedal injuries to medical professional. Call office if there are any questions/concerns. -Rx to be sent to [facility] for one pair diabetic shoes and 3 pair heat moldable insoles. To [facility] [address] [place], [place] [address] Phone: [phone] Fax: [phone]. -Patient/POA to call should there be question/concern in the interim.   Return in about 3 months (around 09/20/2024).  Delon LITTIE Merlin, DPM      [place] LOCATION: [address], [place] [address]                   Office [phone]   [facility] LOCATION: [address] [place], [place] [address] Office [phone]

## 2024-06-28 DIAGNOSIS — E1065 Type 1 diabetes mellitus with hyperglycemia: Secondary | ICD-10-CM | POA: Diagnosis not present

## 2024-07-17 ENCOUNTER — Other Ambulatory Visit (HOSPITAL_COMMUNITY): Payer: Self-pay

## 2024-07-17 MED ORDER — MOUNJARO 15 MG/0.5ML ~~LOC~~ SOAJ
15.0000 mg | SUBCUTANEOUS | 3 refills | Status: DC
  Filled 2024-07-17: qty 2, 28d supply, fill #0
  Filled 2024-08-13: qty 2, 28d supply, fill #1
  Filled 2024-09-10: qty 2, 28d supply, fill #2
  Filled 2024-10-07: qty 2, 28d supply, fill #3

## 2024-07-28 DIAGNOSIS — E1065 Type 1 diabetes mellitus with hyperglycemia: Secondary | ICD-10-CM | POA: Diagnosis not present

## 2024-08-16 DIAGNOSIS — E1122 Type 2 diabetes mellitus with diabetic chronic kidney disease: Secondary | ICD-10-CM | POA: Diagnosis not present

## 2024-08-28 DIAGNOSIS — E1065 Type 1 diabetes mellitus with hyperglycemia: Secondary | ICD-10-CM | POA: Diagnosis not present

## 2024-08-29 ENCOUNTER — Telehealth: Payer: Self-pay | Admitting: Adult Health

## 2024-08-29 NOTE — Telephone Encounter (Signed)
 Please send me a CPAP DL

## 2024-08-29 NOTE — Telephone Encounter (Signed)
-----   Message from [facility] sent at 06/19/2024  2:23 PM EDT ----- DL

## 2024-08-29 NOTE — Telephone Encounter (Signed)
 Sarah House

## 2024-09-11 NOTE — Telephone Encounter (Signed)
 In August I put an order in for pressure change but it doesn't look like it was changed. Can you inform patient and resend to DME

## 2024-09-25 DIAGNOSIS — I10 Essential (primary) hypertension: Secondary | ICD-10-CM | POA: Diagnosis not present

## 2024-09-25 DIAGNOSIS — E1169 Type 2 diabetes mellitus with other specified complication: Secondary | ICD-10-CM | POA: Diagnosis not present

## 2024-09-25 DIAGNOSIS — Z794 Long term (current) use of insulin: Secondary | ICD-10-CM | POA: Diagnosis not present

## 2024-09-25 DIAGNOSIS — E782 Mixed hyperlipidemia: Secondary | ICD-10-CM | POA: Diagnosis not present

## 2024-09-25 DIAGNOSIS — D500 Iron deficiency anemia secondary to blood loss (chronic): Secondary | ICD-10-CM | POA: Diagnosis not present

## 2024-09-25 DIAGNOSIS — E7841 Elevated Lipoprotein(a): Secondary | ICD-10-CM | POA: Diagnosis not present

## 2024-09-27 DIAGNOSIS — E1065 Type 1 diabetes mellitus with hyperglycemia: Secondary | ICD-10-CM | POA: Diagnosis not present

## 2024-10-03 ENCOUNTER — Encounter: Payer: Self-pay | Admitting: Podiatry

## 2024-10-03 ENCOUNTER — Ambulatory Visit: Admitting: Podiatry

## 2024-10-03 DIAGNOSIS — M2041 Other hammer toe(s) (acquired), right foot: Secondary | ICD-10-CM | POA: Diagnosis not present

## 2024-10-03 DIAGNOSIS — Z0189 Encounter for other specified special examinations: Secondary | ICD-10-CM | POA: Diagnosis not present

## 2024-10-03 DIAGNOSIS — M2141 Flat foot [pes planus] (acquired), right foot: Secondary | ICD-10-CM

## 2024-10-03 DIAGNOSIS — E119 Type 2 diabetes mellitus without complications: Secondary | ICD-10-CM

## 2024-10-03 DIAGNOSIS — E1142 Type 2 diabetes mellitus with diabetic polyneuropathy: Secondary | ICD-10-CM

## 2024-10-03 DIAGNOSIS — M79674 Pain in right toe(s): Secondary | ICD-10-CM | POA: Diagnosis not present

## 2024-10-03 DIAGNOSIS — M79675 Pain in left toe(s): Secondary | ICD-10-CM | POA: Diagnosis not present

## 2024-10-03 DIAGNOSIS — M2042 Other hammer toe(s) (acquired), left foot: Secondary | ICD-10-CM

## 2024-10-03 DIAGNOSIS — B351 Tinea unguium: Secondary | ICD-10-CM

## 2024-10-03 DIAGNOSIS — M2142 Flat foot [pes planus] (acquired), left foot: Secondary | ICD-10-CM

## 2024-10-12 ENCOUNTER — Encounter: Payer: Self-pay | Admitting: Podiatry

## 2024-10-12 NOTE — Progress Notes (Signed)
"  °  Subjective:  Patient ID: Sarah House, female    DOB: Mar 23, 1956,  MRN: [id number]  Sarah  TONETTE House presents to clinic today for for annual diabetic foot examination and preventative diabetic foot care for painful mycotic toenails x 10 which interfere with daily activities. Pain is relieved with periodic professional debridement. Patient relates she recently lost her husband. Chief Complaint  Patient presents with   [facility]    Rm15 Diabetic foot care/ A1c 7/ DR. Lequita Flor Last visit July 31,2025   New problem(s): None.   PCP is Flor Lequita, MD.  Allergies[1]  Review of Systems: Negative except as noted in the HPI.  Objective: No changes noted in today's physical examination. There were no vitals filed for this visit. Sarah  H House is a pleasant 68 y.o. female in NAD. AAO x 3.   Diabetic foot exam was performed with the following findings:   Vascular Examination: Capillary refill time immediate b/l. Palpable pedal pulses. Pedal hair present b/l. Pedal edema absent. No pain with calf compression b/l. Skin temperature gradient WNL b/l. No cyanosis or clubbing b/l. No ischemia or gangrene noted b/l.   Neurological Examination: Sensation grossly intact b/l with 10 gram monofilament. Vibratory sensation intact b/l. Pt has subjective symptoms of neuropathy.  Dermatological Examination: Pedal skin with normal turgor, texture and tone b/l.  No open wounds. No interdigital macerations.   Toenails 1-5 b/l thick, discolored, elongated with subungual debris and pain on dorsal palpation.   No corns, calluses, nor porokeratotic lesions.  Musculoskeletal Examination: Muscle strength 5/5 to all lower extremity muscle groups bilaterally. Hammertoe deformity noted 2-5 b/l. Pes planus deformity noted bilateral LE.[name] No pain, crepitus or joint limitation noted with ROM b/l LE.  Patient ambulates independently without assistive aids.  Radiographs: None    Assessment/Plan: 1.  Pain due to onychomycosis of toenails of both feet   2. Pes planus of both feet   3. Acquired hammertoes of both feet   4. Diabetic peripheral neuropathy associated with type 2 diabetes mellitus (HCC)   5. Encounter for diabetic foot exam (HCC)     Extended condolences to Sarah House on loss of her husband. Consent given for treatment. Diabetic foot examination performed.. All patient's and/or POA's questions/concerns addressed on today's visit. Mycotic toenails 1-5 b/l debrided in length and girth without incident. Continue foot and shoe inspections daily. Monitor blood glucose per PCP/Endocrinologist's recommendations.Continue soft, supportive shoe gear daily. Report any pedal injuries to medical professional. Call office if there are any quesitons/concerns. -Patient/POA to call should there be question/concern in the interim.   Return in about 3 months (around 01/01/2025).  Delon LITTIE Merlin, DPM      [place] LOCATION: [address], [place] [address]                   Office [phone]   [facility] LOCATION: [address] [place], [place] [address] Office [phone]     [1]  Allergies Allergen Reactions   [place] Auerbach Degludec]    "

## 2024-11-21 ENCOUNTER — Other Ambulatory Visit (HOSPITAL_COMMUNITY): Payer: Self-pay

## 2024-11-22 ENCOUNTER — Other Ambulatory Visit (HOSPITAL_COMMUNITY): Payer: Self-pay

## 2024-11-22 MED ORDER — MOUNJARO 15 MG/0.5ML ~~LOC~~ SOAJ
15.0000 mg | SUBCUTANEOUS | 3 refills | Status: AC
  Filled 2024-11-22: qty 2, 28d supply, fill #0

## 2025-01-22 ENCOUNTER — Ambulatory Visit: Admitting: Podiatry

## 2025-06-18 ENCOUNTER — Telehealth: Admitting: Adult Health
